# Patient Record
Sex: Female | Born: 1975
Health system: Southern US, Community
[De-identification: ages and names within clinical notes are randomized; demographics above are authoritative.]

## PROBLEM LIST (undated history)

## (undated) DIAGNOSIS — K219 Gastro-esophageal reflux disease without esophagitis: Secondary | ICD-10-CM

## (undated) DIAGNOSIS — M797 Fibromyalgia: Secondary | ICD-10-CM

## (undated) DIAGNOSIS — D472 Monoclonal gammopathy: Secondary | ICD-10-CM

## (undated) DIAGNOSIS — I10 Essential (primary) hypertension: Secondary | ICD-10-CM

## (undated) DIAGNOSIS — J329 Chronic sinusitis, unspecified: Secondary | ICD-10-CM

## (undated) HISTORY — DX: Essential (primary) hypertension: I10

## (undated) HISTORY — PX: KNEE SURGERY: SHX244

## (undated) HISTORY — PX: OTHER SURGICAL HISTORY: SHX169

## (undated) HISTORY — DX: Monoclonal gammopathy: D47.2

## (undated) HISTORY — PX: CHOLECYSTECTOMY: SHX55

---

## 2004-05-30 HISTORY — PX: ESOPHAGOGASTRODUODENOSCOPY: SHX1529

## 2011-01-04 DIAGNOSIS — F32A Depression, unspecified: Secondary | ICD-10-CM | POA: Insufficient documentation

## 2012-09-24 DIAGNOSIS — R079 Chest pain, unspecified: Secondary | ICD-10-CM

## 2012-12-23 DIAGNOSIS — K529 Noninfective gastroenteritis and colitis, unspecified: Secondary | ICD-10-CM | POA: Insufficient documentation

## 2013-03-24 DIAGNOSIS — R1013 Epigastric pain: Secondary | ICD-10-CM | POA: Insufficient documentation

## 2013-08-27 DIAGNOSIS — R5383 Other fatigue: Secondary | ICD-10-CM | POA: Insufficient documentation

## 2013-08-27 DIAGNOSIS — M129 Arthropathy, unspecified: Secondary | ICD-10-CM | POA: Insufficient documentation

## 2013-08-27 DIAGNOSIS — R5381 Other malaise: Secondary | ICD-10-CM | POA: Insufficient documentation

## 2013-08-27 DIAGNOSIS — IMO0001 Reserved for inherently not codable concepts without codable children: Secondary | ICD-10-CM | POA: Insufficient documentation

## 2013-09-04 DIAGNOSIS — J019 Acute sinusitis, unspecified: Secondary | ICD-10-CM | POA: Insufficient documentation

## 2013-11-03 DIAGNOSIS — R42 Dizziness and giddiness: Secondary | ICD-10-CM | POA: Insufficient documentation

## 2013-12-29 DIAGNOSIS — R519 Headache, unspecified: Secondary | ICD-10-CM | POA: Insufficient documentation

## 2014-01-07 DIAGNOSIS — IMO0002 Reserved for concepts with insufficient information to code with codable children: Secondary | ICD-10-CM | POA: Insufficient documentation

## 2015-03-16 ENCOUNTER — Emergency Department (HOSPITAL_COMMUNITY)
Admission: EM | Admit: 2015-03-16 | Discharge: 2015-03-16 | Disposition: A | Discharge status: Home / Self Care | Payer: 59 | Source: Home / Self Care | Attending: Emergency Medicine | Admitting: Emergency Medicine

## 2015-03-16 ENCOUNTER — Encounter (HOSPITAL_COMMUNITY): Payer: Self-pay | Admitting: Emergency Medicine

## 2015-03-16 DIAGNOSIS — R42 Dizziness and giddiness: Secondary | ICD-10-CM | POA: Diagnosis not present

## 2015-03-16 DIAGNOSIS — J324 Chronic pansinusitis: Secondary | ICD-10-CM

## 2015-03-16 HISTORY — DX: Chronic sinusitis, unspecified: J32.9

## 2015-03-16 HISTORY — DX: Fibromyalgia: M79.7

## 2015-03-16 MED ORDER — AZITHROMYCIN 250 MG PO TABS: ORAL_TABLET | ORAL | Status: DC

## 2015-03-16 MED ORDER — ONDANSETRON HCL 4 MG PO TABS: 4.0000 mg | ORAL_TABLET | Freq: Three times a day (TID) | ORAL | Status: DC | PRN

## 2015-03-16 MED ORDER — PREDNISONE 50 MG PO TABS: ORAL_TABLET | ORAL | Status: DC

## 2015-03-16 NOTE — ED Provider Notes (Signed)
CSN: [id number]     Arrival date & time 03/16/15  1308 History   First MD Initiated Contact with Patient 03/16/15 1405     Chief Complaint  Patient presents with  . Facial Pain  . Emesis   (Consider location/radiation/quality/duration/timing/severity/associated sxs/prior Treatment) HPI  She is a 39 year old woman here for evaluation of sinus issues. She reports a multiyear history of sinus issues. She states her PCP treats her for 3 or 4 sinus infections a year. She states she takes Flonase daily and uses nasal saline spray frequently. Over the last 2-3 weeks she has had worsening sinus pressure and pain. She also reports postnasal drainage associated with a cough. No fever or shortness of breath. She has been using her MediPort without improvement. She has also been taking Sudafed, Mucinex, and other over-the-counter agents without improvement. Today, she is started having episodes where her head feels funny, she gets warm and nauseous, and then vomits. After vomiting her symptoms resolve. This has occurred twice today.  She denies feeling lightheaded or a spinning sensation, but states her head just feels off.  Past Medical History  Diagnosis Date  . Fibromyalgia   . Chronic sinus infection    Past Surgical History  Procedure Laterality Date  . Cholecystectomy    . Knee surgery Left   . Thumb surgery Right    History reviewed. No pertinent family history. Social History  Substance Use Topics  . Smoking status: Never Smoker   . Smokeless tobacco: None  . Alcohol Use: Yes     Comment: occasional   OB History    No data available     Review of Systems As in history of present illness Allergies  Sulfa antibiotics  Home Medications   Prior to Admission medications   Medication Sig Start Date End Date Taking? Authorizing Provider  fluticasone (FLONASE) 50 MCG/ACT nasal spray Place 2 sprays into both nostrils daily.   Yes Historical Provider, MD  omeprazole (PRILOSEC) 20 MG  capsule Take 20 mg by mouth daily.   Yes Historical Provider, MD  Phenylephrine HCl (SINEX REGULAR NA) Place 2 sprays into the nose as needed.   Yes Historical Provider, MD  venlafaxine (EFFEXOR) 75 MG tablet Take 75 mg by mouth 2 (two) times daily with a meal.   Yes Historical Provider, MD  azithromycin (ZITHROMAX Z-PAK) 250 MG tablet Take 2 pills today, then 1 pill daily until gone. 03/16/15   Melony Overly, MD  ondansetron (ZOFRAN) 4 MG tablet Take 1 tablet (4 mg total) by mouth every 8 (eight) hours as needed for nausea or vomiting. 03/16/15   Melony Overly, MD  predniSONE (DELTASONE) 50 MG tablet Take 1 pill daily for 5 days. 03/16/15   Melony Overly, MD   Meds Ordered and Administered this Visit  Medications - No data to display  BP 157/79 mmHg  Pulse 83  Temp(Src) 98.3 F (36.8 C) (Oral)  Resp 18  SpO2 99% No data found.   Physical Exam  Constitutional: She is oriented to person, place, and time. She appears well-developed and well-nourished. No distress.  HENT:  Mouth/Throat: No oropharyngeal exudate.  Mild tenderness of all sinuses. Nasal mucosa is slightly edematous. Postnasal drainage seen.  Neck: Neck supple.  Cardiovascular: Normal rate, regular rhythm and normal heart sounds.   No murmur heard. Pulmonary/Chest: Effort normal and breath sounds normal. No respiratory distress. She has no wheezes. She has no rales.  Lymphadenopathy:    She has no cervical adenopathy.  Neurological: She is alert and oriented to person, place, and time.    ED Course  Procedures (including critical care time)  Labs Review Labs Reviewed - No data to display  Imaging Review No results found.    MDM   1. Chronic pansinusitis   2. Vertigo    She is having a flare of her chronic sinusitis. Without a fever, I doubt this is bacterial. We'll treat with 5 days of prednisone.  Zofran given for nausea. If she is not improving by Saturday, she will fill the prescription for azithromycin. She  will follow-up with Dr. Janace Hoard as scheduled next week.    Melony Overly, MD 03/16/15 [phone]

## 2015-03-16 NOTE — ED Notes (Signed)
Pt has chronic sinus infections.  She has been suffering from sinus pain and pressure for 2-3 weeks and today started with dizziness, N&V.  Pt has an appointment with an ENT for next Wednesday, but cannot handle the sinus pain any longer.  She has been using the [employer], sinus pain and congestion OTC medications, Sudafed, and nasal spray.

## 2015-03-16 NOTE — Discharge Instructions (Signed)
You have a lot of inflammation in your sinuses. This is likely affecting your inner ear, causing the dizziness and nausea. Take prednisone daily for the next 5 days. If you are not improving by Saturday, fill the prescription for azithromycin. Continue your Flonase, nasal saline spray, and Sudafed. An over-the-counter allergy medicine such as Claritin or Allegra may be beneficial as well. Follow-up with Dr. Janace Hoard as scheduled next week.

## 2015-04-11 ENCOUNTER — Other Ambulatory Visit (HOSPITAL_COMMUNITY): Payer: Self-pay | Admitting: Otolaryngology

## 2015-04-11 DIAGNOSIS — J0191 Acute recurrent sinusitis, unspecified: Secondary | ICD-10-CM

## 2015-04-14 ENCOUNTER — Ambulatory Visit (HOSPITAL_COMMUNITY)
Admission: RE | Admit: 2015-04-14 | Discharge: 2015-04-14 | Disposition: A | Discharge status: Home / Self Care | Payer: 59 | Source: Ambulatory Visit | Attending: Otolaryngology | Admitting: Otolaryngology

## 2015-04-14 DIAGNOSIS — J0191 Acute recurrent sinusitis, unspecified: Secondary | ICD-10-CM | POA: Diagnosis not present

## 2015-04-18 DIAGNOSIS — M76899 Other specified enthesopathies of unspecified lower limb, excluding foot: Secondary | ICD-10-CM | POA: Insufficient documentation

## 2015-04-18 DIAGNOSIS — M5380 Other specified dorsopathies, site unspecified: Secondary | ICD-10-CM | POA: Insufficient documentation

## 2015-05-04 ENCOUNTER — Emergency Department (HOSPITAL_COMMUNITY): Payer: 59

## 2015-05-04 ENCOUNTER — Emergency Department (HOSPITAL_COMMUNITY)
Admission: EM | Admit: 2015-05-04 | Discharge: 2015-05-04 | Disposition: A | Discharge status: Nursing Home (Includes ICF) | Payer: 59 | Source: Home / Self Care | Attending: Family Medicine | Admitting: Family Medicine

## 2015-05-04 ENCOUNTER — Encounter (HOSPITAL_COMMUNITY): Payer: Self-pay | Admitting: Emergency Medicine

## 2015-05-04 ENCOUNTER — Emergency Department (HOSPITAL_COMMUNITY)
Admission: EM | Admit: 2015-05-04 | Discharge: 2015-05-04 | Disposition: A | Discharge status: Home / Self Care | Payer: 59 | Attending: Emergency Medicine | Admitting: Emergency Medicine

## 2015-05-04 DIAGNOSIS — M797 Fibromyalgia: Secondary | ICD-10-CM | POA: Insufficient documentation

## 2015-05-04 DIAGNOSIS — I10 Essential (primary) hypertension: Secondary | ICD-10-CM

## 2015-05-04 DIAGNOSIS — Z79899 Other long term (current) drug therapy: Secondary | ICD-10-CM | POA: Insufficient documentation

## 2015-05-04 DIAGNOSIS — Z791 Long term (current) use of non-steroidal anti-inflammatories (NSAID): Secondary | ICD-10-CM | POA: Insufficient documentation

## 2015-05-04 DIAGNOSIS — R51 Headache: Secondary | ICD-10-CM | POA: Diagnosis not present

## 2015-05-04 DIAGNOSIS — Z8709 Personal history of other diseases of the respiratory system: Secondary | ICD-10-CM | POA: Insufficient documentation

## 2015-05-04 DIAGNOSIS — R42 Dizziness and giddiness: Secondary | ICD-10-CM | POA: Diagnosis present

## 2015-05-04 LAB — COMPREHENSIVE METABOLIC PANEL
ALBUMIN: 4.1 g/dL (ref 3.5–5.0)
ALT: 19 U/L (ref 14–54)
AST: 20 U/L (ref 15–41)
Alkaline Phosphatase: 74 U/L (ref 38–126)
Anion gap: 7 (ref 5–15)
BILIRUBIN TOTAL: 0.7 mg/dL (ref 0.3–1.2)
BUN: 9 mg/dL (ref 6–20)
CO2: 26 mmol/L (ref 22–32)
CREATININE: 0.87 mg/dL (ref 0.44–1.00)
Calcium: 9.3 mg/dL (ref 8.9–10.3)
Chloride: 106 mmol/L (ref 101–111)
GFR calc Af Amer: >60 mL/min (ref >60)
GLUCOSE: 84 mg/dL (ref 65–99)
POTASSIUM: 3.8 mmol/L (ref 3.5–5.1)
Sodium: 139 mmol/L (ref 135–145)
Total Protein: 7.1 g/dL (ref 6.5–8.1)

## 2015-05-04 LAB — CBC WITH DIFFERENTIAL/PLATELET
BASOS ABS: 0 10*3/uL (ref 0.0–0.1)
BASOS PCT: 0 %
Eosinophils Absolute: 0.1 10*3/uL (ref 0.0–0.7)
Eosinophils Relative: 1 %
HEMATOCRIT: 42.5 % (ref 36.0–46.0)
HEMOGLOBIN: 13.9 g/dL (ref 12.0–15.0)
LYMPHS PCT: 23 %
Lymphs Abs: 1.8 10*3/uL (ref 0.7–4.0)
MCH: 28.6 pg (ref 26.0–34.0)
MCHC: 32.7 g/dL (ref 30.0–36.0)
MCV: 87.4 fL (ref 78.0–100.0)
MONO ABS: 0.4 10*3/uL (ref 0.1–1.0)
Monocytes Relative: 5 %
NEUTROS ABS: 5.7 10*3/uL (ref 1.7–7.7)
NEUTROS PCT: 71 %
Platelets: 313 10*3/uL (ref 150–400)
RBC: 4.86 MIL/uL (ref 3.87–5.11)
RDW: 12.5 % (ref 11.5–15.5)
WBC: 8 10*3/uL (ref 4.0–10.5)

## 2015-05-04 NOTE — ED Notes (Signed)
Pt is here today with a BP of 200/104.  She called her doctor and was told to come here.  Pt is dizzy, has blurred vision and a mild headache.  She denies any CP, SOB, palpitations, or numbness or tingling.  Pt being weened off Effexor due to HBP and will be bridged to Cymbalta.  Pt is being seen by Dr. Juventino Slovak.

## 2015-05-04 NOTE — ED Notes (Signed)
The patient said she works for [employer] at [employer].  She said she felt "weird" and the nurse took her blood pressure and initially it was 200/104 so they sent her here.   She first went to [facility] and she was sent here.  She denies any pain, or any other symptoms other than she feels "weird".  Patient is a [employer].

## 2015-05-04 NOTE — ED Notes (Signed)
Pt being transferred to the ED via shuttle for HBP.  Report was called to Cecille Rubin, First RN in the ED.

## 2015-05-04 NOTE — ED Provider Notes (Signed)
CSN: [id number]     Arrival date & time 05/04/15  1514 History   First MD Initiated Contact with Patient 05/04/15 1747     Chief Complaint  Patient presents with  . Hypertension    The patient said she works for [employer] at [employer].  She said she felt "weird" and the nurse took her blood pressure and initially it was 200/104 so they sent her here.      (Consider location/radiation/quality/duration/timing/severity/associated sxs/prior Treatment) Patient is a 39 y.o. female presenting with dizziness. The history is provided by the patient and medical records.  Dizziness Quality:  Unable to specify Severity:  Mild Onset quality:  Gradual Duration:  1 week Timing:  Intermittent Progression:  Waxing and waning Chronicity:  Recurrent Context: not with bowel movement, not with head movement, not with inactivity, not with loss of consciousness, not with medication and not when standing up   Relieved by:  Nothing Worsened by:  Standing up and movement Ineffective treatments:  Lying down Associated symptoms: headaches   Associated symptoms: no chest pain, no shortness of breath and no weakness   Risk factors: no anemia, no heart disease, no multiple medications and no new medications     Past Medical History  Diagnosis Date  . Fibromyalgia   . Chronic sinus infection    Past Surgical History  Procedure Laterality Date  . Cholecystectomy    . Knee surgery Left   . Thumb surgery Right    History reviewed. No pertinent family history. Social History  Substance Use Topics  . Smoking status: Never Smoker   . Smokeless tobacco: None  . Alcohol Use: Yes     Comment: occasional   OB History    No data available     Review of Systems  Constitutional: Negative for fever and fatigue.  HENT: Negative for facial swelling.   Respiratory: Negative for shortness of breath.   Cardiovascular: Negative for chest pain.  Gastrointestinal: Negative for  abdominal pain.  Genitourinary: Negative for dysuria.  Musculoskeletal: Negative for back pain.  Skin: Negative for rash.  Neurological: Positive for dizziness, light-headedness and headaches. Negative for tremors, syncope, speech difficulty and weakness.  Psychiatric/Behavioral: Positive for confusion and decreased concentration.      Allergies  Sulfa antibiotics  Home Medications   Prior to Admission medications   Medication Sig Start Date End Date Taking? Authorizing Provider  cyclobenzaprine (FLEXERIL) 10 MG tablet Take 10 mg by mouth 3 (three) times daily as needed. 04/19/15  Yes Historical Provider, MD  fluticasone (FLONASE) 50 MCG/ACT nasal spray Place 2 sprays into both nostrils daily.   Yes Historical Provider, MD  naproxen (NAPROSYN) 500 MG tablet Take 500 mg by mouth 2 (two) times daily as needed for moderate pain.   Yes Historical Provider, MD  omeprazole (PRILOSEC) 20 MG capsule Take 20 mg by mouth daily.   Yes Historical Provider, MD  venlafaxine (EFFEXOR) 75 MG tablet Take 75 mg by mouth every evening.    Yes Historical Provider, MD  azithromycin (ZITHROMAX Z-PAK) 250 MG tablet Take 2 pills today, then 1 pill daily until gone. 03/16/15   Melony Overly, MD  DULoxetine (CYMBALTA) 30 MG capsule Take 30 mg by mouth daily.    Historical Provider, MD  ondansetron (ZOFRAN) 4 MG tablet Take 1 tablet (4 mg total) by mouth every 8 (eight) hours as needed for nausea or vomiting. 03/16/15   Melony Overly, MD  predniSONE (DELTASONE) 50 MG tablet Take  1 pill daily for 5 days. 03/16/15   Melony Overly, MD   BP 147/96 mmHg  Pulse 77  Temp(Src) 98.1 F (36.7 C) (Oral)  Resp 22  SpO2 100% Physical Exam  Constitutional: She is oriented to person, place, and time. She appears well-developed and well-nourished. No distress.  HENT:  Head: Normocephalic and atraumatic.  Right Ear: External ear normal.  Left Ear: External ear normal.  Nose: Nose normal.  Mouth/Throat: Oropharynx is clear and  moist. No oropharyngeal exudate.  Eyes: Conjunctivae and EOM are normal. Pupils are equal, round, and reactive to light. Right eye exhibits no discharge. Left eye exhibits no discharge. No scleral icterus.  Neck: Normal range of motion. Neck supple. No JVD present. No tracheal deviation present. No thyromegaly present.  Cardiovascular: Normal rate, regular rhythm, normal heart sounds and intact distal pulses.  Exam reveals no gallop and no friction rub.   No murmur heard. Pulmonary/Chest: Effort normal and breath sounds normal. No stridor. No respiratory distress. She has no wheezes. She has no rales. She exhibits no tenderness.  Abdominal: Soft. She exhibits no distension. There is no tenderness.  Musculoskeletal: Normal range of motion. She exhibits no edema or tenderness.  Lymphadenopathy:    She has no cervical adenopathy.  Neurological: She is alert and oriented to person, place, and time. She has normal strength. She displays no atrophy, no tremor and normal reflexes. No cranial nerve deficit or sensory deficit. She exhibits normal muscle tone. She displays no seizure activity. Coordination and gait normal. GCS eye subscore is 4. GCS verbal subscore is 5. GCS motor subscore is 6.  Skin: Skin is warm and dry. No rash noted. She is not diaphoretic. No erythema. No pallor.  Psychiatric: She has a normal mood and affect. Her behavior is normal. Judgment and thought content normal.  Nursing note and vitals reviewed.   ED Course  Procedures (including critical care time) Labs Review Labs Reviewed  CBC WITH DIFFERENTIAL/PLATELET  COMPREHENSIVE METABOLIC PANEL    Imaging Review Ct Head Wo Contrast  05/04/2015  CLINICAL DATA:  Dizzy send headache; hypertension EXAM: CT HEAD WITHOUT CONTRAST TECHNIQUE: Contiguous axial images were obtained from the base of the skull through the vertex without intravenous contrast. COMPARISON:  None. FINDINGS: The ventricles are normal in size and configuration.  The left lateral ventricle is slightly larger than the right lateral ventricle, an anatomic variant. 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 There is no intracranial mass, hemorrhage, extra-axial fluid collection, or midline shift. The gray-white compartments are normal. No acute infarct apparent. The bony calvarium appears intact. The mastoid air cells are clear. 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 Ventricles are normal in size and configuration. No intracranial mass, hemorrhage, or gray -white compartment lesions/acute appearing infarct. Electronically Signed   By: Lowella Grip III M.D.   On: 05/04/2015 19:30   I have personally reviewed and evaluated these images and lab results as part of my medical decision-making.   EKG Interpretation None      MDM   Final diagnoses:  Essential hypertension    HTN today to 200SBP  Pt "feels weird".  Mild HA, dizziness.  Not-reproducible on exam.  No focal findings.  Also currently tapering off of caffeiene to try to improve BP.  Also transitioning off of Effexor for Cymbalta for FM and depression.  Possibly multifactorial.  Head Ct negative  Labs WNL.  No apparent neurologic deficits on exam.  Discussed HA medicines and pt declined IVF, or MG cocktail.  Appreciative of BP recs and will f/u with PCP.  Patient was given return precautions for hypertension and headaches.  Pt advised on use of medications as applicable.  Advised to return for actely worsening symptoms, inability to take medications, or other acute concerns.  Advised to follow up with PCP in 1 week.  Patient was in agreement with and expressed understanding of follow plan, plan of care, and return precautions.  All questions answered prior to discharge.  Patient was discharged in stable condition, ambulating without difficulty.  Patient care was discussed with my attending, Dr. Roxanne Mins.   Hoyle Sauer, MD 49/35/52  1747  Delora Fuel, MD 15/95/39 [id number]

## 2015-05-04 NOTE — ED Provider Notes (Signed)
CSN: [id number]     Arrival date & time 05/04/15  1358 History   First MD Initiated Contact with Patient 05/04/15 1452     Chief Complaint  Patient presents with  . Hypertension  . Dizziness  . Headache   (Consider location/radiation/quality/duration/timing/severity/associated sxs/prior Treatment) Patient is a 39 y.o. female presenting with hypertension, dizziness, and headaches. The history is provided by the patient.  Hypertension This is a chronic (taken off of bp meds by lmd for possible drug-drug interaction and subsequent hypotension.) problem. The current episode started 6 to 12 hours ago (awoke this am not feeling well with dizziness and hbp and sl nausea and headache.). The problem has been gradually worsening. Associated symptoms include headaches. Pertinent negatives include no chest pain and no shortness of breath.  Dizziness Associated symptoms: headaches and nausea   Associated symptoms: no chest pain, no palpitations and no shortness of breath   Headache Associated symptoms: dizziness and nausea     Past Medical History  Diagnosis Date  . Fibromyalgia   . Chronic sinus infection    Past Surgical History  Procedure Laterality Date  . Cholecystectomy    . Knee surgery Left   . Thumb surgery Right    History reviewed. No pertinent family history. Social History  Substance Use Topics  . Smoking status: Never Smoker   . Smokeless tobacco: None  . Alcohol Use: Yes     Comment: occasional   OB History    No data available     Review of Systems  Constitutional: Negative.   Respiratory: Negative for chest tightness, shortness of breath and wheezing.   Cardiovascular: Positive for leg swelling. Negative for chest pain and palpitations.  Gastrointestinal: Positive for nausea.  Neurological: Positive for dizziness and headaches.  All other systems reviewed and are negative.   Allergies  Sulfa antibiotics  Home Medications   Prior to Admission medications    Medication Sig Start Date End Date Taking? Authorizing Provider  fluticasone (FLONASE) 50 MCG/ACT nasal spray Place 2 sprays into both nostrils daily.   Yes Historical Provider, MD  omeprazole (PRILOSEC) 20 MG capsule Take 20 mg by mouth daily.   Yes Historical Provider, MD  venlafaxine (EFFEXOR) 75 MG tablet Take 75 mg by mouth 2 (two) times daily with a meal.   Yes Historical Provider, MD  azithromycin (ZITHROMAX Z-PAK) 250 MG tablet Take 2 pills today, then 1 pill daily until gone. 03/16/15   Melony Overly, MD  ondansetron (ZOFRAN) 4 MG tablet Take 1 tablet (4 mg total) by mouth every 8 (eight) hours as needed for nausea or vomiting. 03/16/15   Melony Overly, MD  Phenylephrine HCl (SINEX REGULAR NA) Place 2 sprays into the nose as needed.    Historical Provider, MD  predniSONE (DELTASONE) 50 MG tablet Take 1 pill daily for 5 days. 03/16/15   Melony Overly, MD   Meds Ordered and Administered this Visit  Medications - No data to display  BP 157/104 mmHg  Pulse 88  Temp(Src) 99.2 F (37.3 C) (Oral)  SpO2 99% No data found.   Physical Exam  Constitutional: She is oriented to person, place, and time. She appears well-developed and well-nourished. No distress.  HENT:  Head: Normocephalic.  Right Ear: External ear normal.  Left Ear: External ear normal.  Mouth/Throat: Oropharynx is clear and moist.  Eyes: Pupils are equal, round, and reactive to light.  Neck: Normal range of motion. Neck supple.  Cardiovascular: Normal rate, regular rhythm, normal  heart sounds and intact distal pulses.   Pulmonary/Chest: Effort normal and breath sounds normal.  Abdominal: Soft. Bowel sounds are normal.  Lymphadenopathy:    She has no cervical adenopathy.  Neurological: She is alert and oriented to person, place, and time. No cranial nerve deficit. Coordination normal.  Skin: Skin is warm and dry.  Nursing note and vitals reviewed.   ED Course  Procedures (including critical care time)  Labs  Review Labs Reviewed - No data to display ED ECG REPORT   Date: 05/04/2015  Rate: 88  Rhythm: normal sinus rhythm  QRS Axis: normal  Intervals: normal  ST/T Wave abnormalities: normal  Conduction Disutrbances:none  Narrative Interpretation:   Old EKG Reviewed: none available  I have personally reviewed the EKG tracing and agree with the computerized printout as noted.  Imaging Review No results found.   Visual Acuity Review  Right Eye Distance:   Left Eye Distance:   Bilateral Distance:    Right Eye Near:   Left Eye Near:    Bilateral Near:         MDM   1. Hypertension, accelerated    Sent for eval of severe hbp with assoc neuro sx since this am.    Billy Fischer, MD 05/04/15 [phone]

## 2015-05-04 NOTE — ED Notes (Signed)
MD at bedside. 

## 2015-07-04 DIAGNOSIS — K210 Gastro-esophageal reflux disease with esophagitis: Secondary | ICD-10-CM | POA: Diagnosis not present

## 2015-07-04 DIAGNOSIS — M797 Fibromyalgia: Secondary | ICD-10-CM | POA: Diagnosis not present

## 2015-07-04 DIAGNOSIS — I10 Essential (primary) hypertension: Secondary | ICD-10-CM | POA: Diagnosis not present

## 2015-07-15 MED FILL — FLUTICASONE PROP 50 MCG SPR: 50 | 30 days supply | Qty: 16 | Fill #2

## 2015-07-15 MED FILL — DULoxetine HCL 60 MG CPEP: 60 | 90 days supply | Qty: 90 | Fill #0

## 2015-07-19 DIAGNOSIS — H5213 Myopia, bilateral: Secondary | ICD-10-CM | POA: Diagnosis not present

## 2015-07-26 DIAGNOSIS — N951 Menopausal and female climacteric states: Secondary | ICD-10-CM | POA: Diagnosis not present

## 2015-07-26 DIAGNOSIS — Z1231 Encounter for screening mammogram for malignant neoplasm of breast: Secondary | ICD-10-CM | POA: Diagnosis not present

## 2015-07-26 DIAGNOSIS — Z1151 Encounter for screening for human papillomavirus (HPV): Secondary | ICD-10-CM | POA: Diagnosis not present

## 2015-07-26 DIAGNOSIS — Z01419 Encounter for gynecological examination (general) (routine) without abnormal findings: Secondary | ICD-10-CM | POA: Diagnosis not present

## 2015-07-26 DIAGNOSIS — R87810 Cervical high risk human papillomavirus (HPV) DNA test positive: Secondary | ICD-10-CM | POA: Diagnosis not present

## 2015-08-29 MED FILL — OMEPRAZOLE DR 20 MG CAPSULE: 20 | 90 days supply | Qty: 90 | Fill #0

## 2015-08-29 MED FILL — FLUTICASONE PROP 50 MCG SPR: 50 | 30 days supply | Qty: 16 | Fill #3

## 2015-10-05 MED FILL — FLUTICASONE PROP 50 MCG SPR: 50 | 30 days supply | Qty: 16 | Fill #0

## 2015-10-05 MED FILL — CYCLOBENZAPRINE 10 MG TAB: 10 | 15 days supply | Qty: 15 | Fill #0

## 2015-10-31 DIAGNOSIS — R5383 Other fatigue: Secondary | ICD-10-CM | POA: Diagnosis not present

## 2015-10-31 DIAGNOSIS — K210 Gastro-esophageal reflux disease with esophagitis: Secondary | ICD-10-CM | POA: Diagnosis not present

## 2015-10-31 DIAGNOSIS — I10 Essential (primary) hypertension: Secondary | ICD-10-CM | POA: Diagnosis not present

## 2015-11-02 DIAGNOSIS — M9903 Segmental and somatic dysfunction of lumbar region: Secondary | ICD-10-CM | POA: Diagnosis not present

## 2015-11-02 DIAGNOSIS — M9904 Segmental and somatic dysfunction of sacral region: Secondary | ICD-10-CM | POA: Diagnosis not present

## 2015-11-02 DIAGNOSIS — M9905 Segmental and somatic dysfunction of pelvic region: Secondary | ICD-10-CM | POA: Diagnosis not present

## 2015-11-02 DIAGNOSIS — M62838 Other muscle spasm: Secondary | ICD-10-CM | POA: Diagnosis not present

## 2015-11-02 DIAGNOSIS — M9901 Segmental and somatic dysfunction of cervical region: Secondary | ICD-10-CM | POA: Diagnosis not present

## 2015-11-02 DIAGNOSIS — M9902 Segmental and somatic dysfunction of thoracic region: Secondary | ICD-10-CM | POA: Diagnosis not present

## 2015-11-03 DIAGNOSIS — M9904 Segmental and somatic dysfunction of sacral region: Secondary | ICD-10-CM | POA: Diagnosis not present

## 2015-11-03 DIAGNOSIS — M9905 Segmental and somatic dysfunction of pelvic region: Secondary | ICD-10-CM | POA: Diagnosis not present

## 2015-11-03 DIAGNOSIS — M62838 Other muscle spasm: Secondary | ICD-10-CM | POA: Diagnosis not present

## 2015-11-03 DIAGNOSIS — M9903 Segmental and somatic dysfunction of lumbar region: Secondary | ICD-10-CM | POA: Diagnosis not present

## 2015-11-03 DIAGNOSIS — M9901 Segmental and somatic dysfunction of cervical region: Secondary | ICD-10-CM | POA: Diagnosis not present

## 2015-11-03 DIAGNOSIS — M9902 Segmental and somatic dysfunction of thoracic region: Secondary | ICD-10-CM | POA: Diagnosis not present

## 2015-11-04 DIAGNOSIS — M797 Fibromyalgia: Secondary | ICD-10-CM | POA: Diagnosis not present

## 2015-11-04 DIAGNOSIS — K210 Gastro-esophageal reflux disease with esophagitis: Secondary | ICD-10-CM | POA: Diagnosis not present

## 2015-11-04 DIAGNOSIS — M545 Low back pain: Secondary | ICD-10-CM | POA: Diagnosis not present

## 2015-11-04 DIAGNOSIS — I10 Essential (primary) hypertension: Secondary | ICD-10-CM | POA: Diagnosis not present

## 2015-11-07 DIAGNOSIS — M62838 Other muscle spasm: Secondary | ICD-10-CM | POA: Diagnosis not present

## 2015-11-07 DIAGNOSIS — M9903 Segmental and somatic dysfunction of lumbar region: Secondary | ICD-10-CM | POA: Diagnosis not present

## 2015-11-07 DIAGNOSIS — M9901 Segmental and somatic dysfunction of cervical region: Secondary | ICD-10-CM | POA: Diagnosis not present

## 2015-11-07 DIAGNOSIS — M9905 Segmental and somatic dysfunction of pelvic region: Secondary | ICD-10-CM | POA: Diagnosis not present

## 2015-11-07 DIAGNOSIS — M9902 Segmental and somatic dysfunction of thoracic region: Secondary | ICD-10-CM | POA: Diagnosis not present

## 2015-11-07 DIAGNOSIS — M9904 Segmental and somatic dysfunction of sacral region: Secondary | ICD-10-CM | POA: Diagnosis not present

## 2015-11-08 MED FILL — DULoxetine HCL 60 MG CPEP: 60 | 90 days supply | Qty: 90 | Fill #1

## 2015-11-09 DIAGNOSIS — M9905 Segmental and somatic dysfunction of pelvic region: Secondary | ICD-10-CM | POA: Diagnosis not present

## 2015-11-09 DIAGNOSIS — M9904 Segmental and somatic dysfunction of sacral region: Secondary | ICD-10-CM | POA: Diagnosis not present

## 2015-11-09 DIAGNOSIS — M9901 Segmental and somatic dysfunction of cervical region: Secondary | ICD-10-CM | POA: Diagnosis not present

## 2015-11-09 DIAGNOSIS — M62838 Other muscle spasm: Secondary | ICD-10-CM | POA: Diagnosis not present

## 2015-11-09 DIAGNOSIS — M9903 Segmental and somatic dysfunction of lumbar region: Secondary | ICD-10-CM | POA: Diagnosis not present

## 2015-11-09 DIAGNOSIS — M9902 Segmental and somatic dysfunction of thoracic region: Secondary | ICD-10-CM | POA: Diagnosis not present

## 2015-11-23 DIAGNOSIS — D2271 Melanocytic nevi of right lower limb, including hip: Secondary | ICD-10-CM | POA: Diagnosis not present

## 2015-11-23 DIAGNOSIS — L814 Other melanin hyperpigmentation: Secondary | ICD-10-CM | POA: Diagnosis not present

## 2015-11-23 DIAGNOSIS — L738 Other specified follicular disorders: Secondary | ICD-10-CM | POA: Diagnosis not present

## 2015-11-23 DIAGNOSIS — L812 Freckles: Secondary | ICD-10-CM | POA: Diagnosis not present

## 2015-12-01 MED FILL — OMEPRAZOLE DR 20 MG CAPSULE: 20 | 90 days supply | Qty: 90 | Fill #1

## 2015-12-01 MED FILL — FLUTICASONE PROP 50 MCG SPR: 50 | 30 days supply | Qty: 16 | Fill #4

## 2015-12-13 DIAGNOSIS — M797 Fibromyalgia: Secondary | ICD-10-CM | POA: Diagnosis not present

## 2015-12-13 DIAGNOSIS — I10 Essential (primary) hypertension: Secondary | ICD-10-CM | POA: Diagnosis not present

## 2015-12-13 DIAGNOSIS — G43909 Migraine, unspecified, not intractable, without status migrainosus: Secondary | ICD-10-CM | POA: Diagnosis not present

## 2015-12-13 MED FILL — PROPRANOLOL 20 MG TABLET: 20 | 30 days supply | Qty: 30 | Fill #0

## 2015-12-15 ENCOUNTER — Other Ambulatory Visit (HOSPITAL_COMMUNITY): Payer: Self-pay | Admitting: Family Medicine

## 2015-12-15 DIAGNOSIS — R519 Headache, unspecified: Secondary | ICD-10-CM

## 2015-12-15 DIAGNOSIS — R51 Headache: Principal | ICD-10-CM

## 2015-12-22 ENCOUNTER — Ambulatory Visit (HOSPITAL_COMMUNITY)
Admission: RE | Admit: 2015-12-22 | Discharge: 2015-12-22 | Disposition: A | Discharge status: Home / Self Care | Payer: 59 | Source: Ambulatory Visit | Attending: Family Medicine | Admitting: Family Medicine

## 2015-12-22 DIAGNOSIS — R930 Abnormal findings on diagnostic imaging of skull and head, not elsewhere classified: Secondary | ICD-10-CM | POA: Insufficient documentation

## 2015-12-22 DIAGNOSIS — R51 Headache: Secondary | ICD-10-CM | POA: Diagnosis not present

## 2015-12-22 DIAGNOSIS — R519 Headache, unspecified: Secondary | ICD-10-CM

## 2016-01-09 DIAGNOSIS — E669 Obesity, unspecified: Secondary | ICD-10-CM | POA: Insufficient documentation

## 2016-01-10 DIAGNOSIS — Z6833 Body mass index (BMI) 33.0-33.9, adult: Secondary | ICD-10-CM | POA: Diagnosis not present

## 2016-01-10 DIAGNOSIS — G43909 Migraine, unspecified, not intractable, without status migrainosus: Secondary | ICD-10-CM | POA: Diagnosis not present

## 2016-01-25 MED FILL — FLUTICASONE PROP 50 MCG SPR: 50 | 30 days supply | Qty: 16 | Fill #5

## 2016-01-25 MED FILL — PROPRANOLOL ER 60 MG CAPSUL: 60 | 30 days supply | Qty: 30 | Fill #0

## 2016-02-28 MED FILL — OMEPRAZOLE DR 20 MG CAPSULE: 20 | 90 days supply | Qty: 90 | Fill #2

## 2016-02-28 MED FILL — FLUTICASONE PROP 50 MCG SPR: 50 | 30 days supply | Qty: 16 | Fill #6

## 2016-02-28 MED FILL — PROPRANOLOL ER 60 MG CAPSUL: 60 | 30 days supply | Qty: 30 | Fill #1

## 2016-02-28 MED FILL — DULoxetine HCL 60 MG CPEP: 60 | 90 days supply | Qty: 90 | Fill #2

## 2016-03-26 DIAGNOSIS — G43909 Migraine, unspecified, not intractable, without status migrainosus: Secondary | ICD-10-CM | POA: Insufficient documentation

## 2016-03-27 DIAGNOSIS — I10 Essential (primary) hypertension: Secondary | ICD-10-CM | POA: Diagnosis not present

## 2016-03-27 DIAGNOSIS — R51 Headache: Secondary | ICD-10-CM | POA: Diagnosis not present

## 2016-03-27 DIAGNOSIS — G43909 Migraine, unspecified, not intractable, without status migrainosus: Secondary | ICD-10-CM | POA: Diagnosis not present

## 2016-03-27 DIAGNOSIS — M797 Fibromyalgia: Secondary | ICD-10-CM | POA: Diagnosis not present

## 2016-03-27 DIAGNOSIS — K210 Gastro-esophageal reflux disease with esophagitis: Secondary | ICD-10-CM | POA: Diagnosis not present

## 2016-03-27 DIAGNOSIS — Z6834 Body mass index (BMI) 34.0-34.9, adult: Secondary | ICD-10-CM | POA: Diagnosis not present

## 2016-05-25 MED FILL — FLUTICASONE PROP 50 MCG SPR: 50 | 30 days supply | Qty: 16 | Fill #0

## 2016-05-25 MED FILL — OMEPRAZOLE 20 MG CAPSULE DR: 20 | 90 days supply | Qty: 90 | Fill #3

## 2016-05-25 MED FILL — DULoxetine HCL 60 MG CPEP: 60 | 90 days supply | Qty: 90 | Fill #3

## 2016-05-25 MED FILL — PROPRANOLOL ER 60 MG CAPSUL: 60 | 30 days supply | Qty: 30 | Fill #2

## 2016-06-26 DIAGNOSIS — Z6834 Body mass index (BMI) 34.0-34.9, adult: Secondary | ICD-10-CM | POA: Diagnosis not present

## 2016-06-26 DIAGNOSIS — J0101 Acute recurrent maxillary sinusitis: Secondary | ICD-10-CM | POA: Diagnosis not present

## 2016-06-27 MED FILL — SCOPOLAMINE 1 MG/3 DAY PATC: 1 | 12 days supply | Qty: 4 | Fill #0

## 2016-06-29 MED FILL — PROPRANOLOL ER 60 MG CAPSUL: 60 | 30 days supply | Qty: 30 | Fill #3

## 2016-07-06 MED FILL — FLUTICASONE PROP 50 MCG SPR: 50 | 30 days supply | Qty: 16 | Fill #0

## 2016-07-31 DIAGNOSIS — H5213 Myopia, bilateral: Secondary | ICD-10-CM | POA: Diagnosis not present

## 2016-08-31 MED FILL — FLUTICASONE PROP 50 MCG SPR: 50 | 30 days supply | Qty: 16 | Fill #0

## 2016-09-21 MED FILL — CYCLOBENZAPRINE 10 MG TAB: 10 | 90 days supply | Qty: 90 | Fill #0

## 2016-09-25 MED FILL — DULoxetine HCL 60 MG CPEP: 60 | 30 days supply | Qty: 30 | Fill #0

## 2016-09-25 MED FILL — OMEPRAZOLE 20 MG CAPSULE DR: 20 | 30 days supply | Qty: 30 | Fill #0

## 2016-09-25 MED FILL — PROPRANOLOL ER 80 MG CAP: 80 | 30 days supply | Qty: 30 | Fill #0

## 2016-10-09 DIAGNOSIS — Z0000 Encounter for general adult medical examination without abnormal findings: Secondary | ICD-10-CM | POA: Insufficient documentation

## 2016-10-15 DIAGNOSIS — I10 Essential (primary) hypertension: Secondary | ICD-10-CM | POA: Diagnosis not present

## 2016-10-15 DIAGNOSIS — K210 Gastro-esophageal reflux disease with esophagitis: Secondary | ICD-10-CM | POA: Diagnosis not present

## 2016-10-15 DIAGNOSIS — G43909 Migraine, unspecified, not intractable, without status migrainosus: Secondary | ICD-10-CM | POA: Diagnosis not present

## 2016-10-15 DIAGNOSIS — M130 Polyarthritis, unspecified: Secondary | ICD-10-CM | POA: Diagnosis not present

## 2016-10-15 DIAGNOSIS — Z1389 Encounter for screening for other disorder: Secondary | ICD-10-CM | POA: Diagnosis not present

## 2016-10-15 DIAGNOSIS — Z0000 Encounter for general adult medical examination without abnormal findings: Secondary | ICD-10-CM | POA: Diagnosis not present

## 2016-10-15 DIAGNOSIS — Z6835 Body mass index (BMI) 35.0-35.9, adult: Secondary | ICD-10-CM | POA: Diagnosis not present

## 2016-10-15 DIAGNOSIS — M545 Low back pain: Secondary | ICD-10-CM | POA: Diagnosis not present

## 2016-10-15 MED FILL — LISINOPRIL-HCTZ 20-25 MG TA: 20-25 | 90 days supply | Qty: 45 | Fill #0

## 2016-10-15 MED FILL — FUROSEMIDE 20 MG TABLET: 20 | 90 days supply | Qty: 90 | Fill #0

## 2016-10-26 MED FILL — OMEPRAZOLE 20 MG CAPSULE DR: 20 | 90 days supply | Qty: 90 | Fill #0

## 2016-11-12 MED FILL — DULoxetine HCL 60 MG CPEP: 60 | 90 days supply | Qty: 90 | Fill #0

## 2016-11-12 MED FILL — PROPRANOLOL ER 80 MG CAP: 80 | 90 days supply | Qty: 90 | Fill #0

## 2017-01-28 MED FILL — OMEPRAZOLE 20 MG CAP: 20 | 90 days supply | Qty: 90 | Fill #0

## 2017-02-12 DIAGNOSIS — Z1231 Encounter for screening mammogram for malignant neoplasm of breast: Secondary | ICD-10-CM | POA: Diagnosis not present

## 2017-02-12 DIAGNOSIS — Z01419 Encounter for gynecological examination (general) (routine) without abnormal findings: Secondary | ICD-10-CM | POA: Diagnosis not present

## 2017-02-12 DIAGNOSIS — Z6836 Body mass index (BMI) 36.0-36.9, adult: Secondary | ICD-10-CM | POA: Diagnosis not present

## 2017-02-12 DIAGNOSIS — Z30433 Encounter for removal and reinsertion of intrauterine contraceptive device: Secondary | ICD-10-CM | POA: Diagnosis not present

## 2017-02-22 MED FILL — PROPRANOLOL ER 80 MG CAP: 80 | 90 days supply | Qty: 90 | Fill #1

## 2017-02-22 MED FILL — DULoxetine HCL 60 MG CPEP: 60 | 90 days supply | Qty: 90 | Fill #1

## 2017-02-22 MED FILL — CYCLOBENZAPRINE 10 MG TABLE: 10 | 90 days supply | Qty: 90 | Fill #1

## 2017-03-27 DIAGNOSIS — N939 Abnormal uterine and vaginal bleeding, unspecified: Secondary | ICD-10-CM | POA: Diagnosis not present

## 2017-04-15 DIAGNOSIS — M797 Fibromyalgia: Secondary | ICD-10-CM | POA: Diagnosis not present

## 2017-04-15 DIAGNOSIS — J0101 Acute recurrent maxillary sinusitis: Secondary | ICD-10-CM | POA: Diagnosis not present

## 2017-04-15 DIAGNOSIS — K210 Gastro-esophageal reflux disease with esophagitis: Secondary | ICD-10-CM | POA: Diagnosis not present

## 2017-04-15 DIAGNOSIS — I10 Essential (primary) hypertension: Secondary | ICD-10-CM | POA: Diagnosis not present

## 2017-04-15 DIAGNOSIS — Z6834 Body mass index (BMI) 34.0-34.9, adult: Secondary | ICD-10-CM | POA: Diagnosis not present

## 2017-04-15 DIAGNOSIS — M130 Polyarthritis, unspecified: Secondary | ICD-10-CM | POA: Diagnosis not present

## 2017-04-15 DIAGNOSIS — J309 Allergic rhinitis, unspecified: Secondary | ICD-10-CM | POA: Diagnosis not present

## 2017-04-15 MED FILL — CEFUROXIME AXETIL 500 MG TA: 500 | 7 days supply | Qty: 14 | Fill #0

## 2017-05-31 MED FILL — DULoxetine HCL 60 MG CPEP: 60 | 90 days supply | Qty: 90 | Fill #2

## 2017-05-31 MED FILL — OMEPRAZOLE 20 MG CAP: 20 | 90 days supply | Qty: 90 | Fill #1

## 2017-07-02 DIAGNOSIS — I10 Essential (primary) hypertension: Secondary | ICD-10-CM | POA: Insufficient documentation

## 2017-07-02 DIAGNOSIS — K2100 Gastro-esophageal reflux disease with esophagitis, without bleeding: Secondary | ICD-10-CM | POA: Insufficient documentation

## 2017-07-02 DIAGNOSIS — J309 Allergic rhinitis, unspecified: Secondary | ICD-10-CM | POA: Insufficient documentation

## 2017-07-02 NOTE — Progress Notes (Signed)
Office Visit Note  Patient: Katie Elliott             Date of Birth: [birth year]           MRN: [id number]             PCP: Rory Percy, MD Referring: Rory Percy, MD Visit Date: 07/03/2017 Occupation: executive assistant for [employer]    Subjective:  Joint pain.   History of Present Illness: Katie Elliott is a 42 y.o. female seen in consultation per request of her PCP. According to patient her symptoms started when she was in her 55s with bilateral hip pain which she describes over the trochanteric area. She states she's also had discomfort in her knees for some time. She had surgery on her left knee in 2008 for left patellar dislocation. She notices crepitus in her bilateral knee joints. She also reports intermittent swelling in her knee joints. She is complains of pain in her neck and lower back for the last 5 years for which she seen her PCP and was told that she has fibromyalgia syndrome. She has not noticed much improvement in her symptoms of fibromyalgia despite taking medications. She's also seen a chiropractor who did x-rays several years ago and told her that she may have discussed disease of her spine. She has discomfort in her bilateral shoulders or so in the left than the right side. She's been also having discomfort in her bilateral hands and her thumbs for the last 2 years. She has noticed decreased grip strength in her right hand.  Activities of Daily Living:  Patient reports morning stiffness for 1-2 hours.   Patient Reports nocturnal pain.  Difficulty dressing/grooming: Denies Difficulty climbing stairs: Reports Difficulty getting out of chair: Reports Difficulty using hands for taps, buttons, cutlery, and/or writing: Reports   Review of Systems  Constitutional: Positive for fatigue. Negative for night sweats, weight gain, weight loss and weakness.  HENT: Positive for mouth dryness. Negative for mouth sores, trouble swallowing, trouble swallowing and nose  dryness.   Eyes: Positive for dryness. Negative for pain, redness and visual disturbance.  Respiratory: Negative for cough, shortness of breath and difficulty breathing.   Cardiovascular: Positive for hypertension. Negative for chest pain, palpitations, irregular heartbeat and swelling in legs/feet.  Gastrointestinal: Positive for constipation. Negative for blood in stool and diarrhea.  Endocrine: Negative for increased urination.  Genitourinary: Negative for vaginal dryness.  Musculoskeletal: Positive for arthralgias, joint pain and morning stiffness. Negative for joint swelling, myalgias, muscle weakness, muscle tenderness and myalgias.  Skin: Positive for sensitivity to sunlight. Negative for color change, rash, hair loss, skin tightness and ulcers.  Allergic/Immunologic: Negative for susceptible to infections.  Neurological: Negative for dizziness, memory loss and night sweats.  Hematological: Negative for swollen glands.  Psychiatric/Behavioral: Positive for depressed mood and sleep disturbance. The patient is not nervous/anxious.     PMFS History:  Patient Active Problem List   Diagnosis Date Noted  . Essential hypertension 07/02/2017  . Gastroesophageal reflux disease with esophagitis 07/02/2017  . Allergic rhinitis 07/02/2017    Past Medical History:  Diagnosis Date  . Chronic sinus infection   . Fibromyalgia     Family History  Problem Relation Age of Onset  . Bipolar disorder Mother   . Hypertension Father   . Gout Father   . Irritable bowel syndrome Father   . Scoliosis Sister   . Bipolar disorder Sister   . Eczema Sister   . Asthma Sister   .  Hearing loss Sister        has hearing aid    Past Surgical History:  Procedure Laterality Date  . CHOLECYSTECTOMY    . KNEE SURGERY Left   . thumb surgery Right    Social History   Social History Narrative  . Not on file     Objective: Vital Signs: BP 130/72 (BP Location: Left Arm, Patient Position: Sitting,  Cuff Size: Normal)   Pulse 84   Resp 16   Ht 5\' 6"  (1.676 m)   Wt 222 lb (100.7 kg)   BMI 35.83 kg/m    Physical Exam  Constitutional: She is oriented to person, place, and time. She appears well-developed and well-nourished.  HENT:  Head: Normocephalic and atraumatic.  Eyes: Conjunctivae and EOM are normal.  Neck: Normal range of motion.  Cardiovascular: Normal rate, regular rhythm, normal heart sounds and intact distal pulses.  Pulmonary/Chest: Effort normal and breath sounds normal.  Abdominal: Soft. Bowel sounds are normal.  Lymphadenopathy:    She has no cervical adenopathy.  Neurological: She is alert and oriented to person, place, and time.  Skin: Skin is warm and dry. Capillary refill takes less than 2 seconds.  Psychiatric: She has a normal mood and affect. Her behavior is normal.  Nursing note and vitals reviewed.    Musculoskeletal Exam: She has pain and discomfort with range of motion of her C-spine and trapezius is spasm. She is painful range of motion of her left shoulder especially over the anterior aspect. Right shoulder joint is full range of motion. Elbow joints wrist joint MCPs PIPs DIPs with good range of motion. She has tenderness over bilateral CMC and bilateral first MCP joint. No synovitis was noted. Hip joints are good range of motion. She is tenderness over bilateral trochanteric bursa consistent with trochanteric bursitis. She is crepitus in her bilateral knee joints and some tenderness over the medial aspect of her knee joints without any warmth swelling or effusion. Ankle joints MTPs PIPs with good range of motion with no tenderness.  CDAI Exam: No CDAI exam completed.    Investigation: No additional findings.   Imaging: Xr Cervical Spine 2 Or 3 Views  Result Date: 07/03/2017 Multilevel spondylosis was noted. Disc space narrowing and anterior spurring was noted between C5-6 and C6-7. Mild facet joint arthropathy was noted. Impression: multi-level  spondylosis of cervical spine.  Xr Hand 2 View Left  Result Date: 07/03/2017 PIP/DIP narrowing was noted. CMC narrowing was noted. No significant MCP joint narrowing or erosive changes were noted. No intercarpal joint space narrowing was noted. 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  Xr Hand 2 View Right  Result Date: 07/03/2017 Mild PIP/DIP joint space narrowing was noted. CMC joint narrowing and spurring was noted. No MCP joint narrowing or erosive changes were noted. No intercarpal joint space narrowing was noted. Impression: These findings were consistent with mild osteoarthritis.  Xr Knee 3 View Left  Result Date: 07/03/2017 Mild to moderate medial compartment narrowing was noted. No chondrocalcinosis was noted. Mild patellofemoral narrowing was noted. 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  Xr Knee 3 View Right  Result Date: 07/03/2017 Mild to moderate medial compartment narrowing was noted. No chondrocalcinosis was noted. Mild patellofemoral narrowing was noted. 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  Xr Lumbar Spine 2-3 Views  Result Date: 07/03/2017 No significant disc space narrowing was noted. Mild facet joint arthropathy  was noted. No syndesmophytes were noted. No SI joint changes were noted. Impression : unremarkable x-ray of the lumbar spine.  Xr Shoulder Left  Result Date: 07/03/2017 No glenohumeral joint space narrowing was noted. No chondrocalcinosis was noted. Type II acromion was noted. Impression: unremarkable x-ray of the shoulder joint.   Speciality Comments: No specialty comments available.    Procedures:  No procedures performed Allergies: Sulfa antibiotics   Assessment / Plan:     Visit Diagnoses: Chronic left shoulder pain - patient complains of chronic pain in her left shoulder and discomfort. She  has difficulty sleeping on the left side. Plan: XR Shoulder Left. The x-ray was unremarkable I believe her symptoms are related to tendinopathy. I will refer her to physical therapy have also given her a handout on neck exercises.  Pain in both hands - she complains of pain and stiffness in bilateral hands and decreased grip strength. She surgery on her right thumb as child due to a fracture. Plan: XR Hand 2 View Right, XR Hand 2 View Left. She does have some osteoarthritic changes involving her PIPs and CMC joints. She gives history of intermittent hand swelling. I will obtain following labs today. I will also schedule ultrasound to rule out synovitis.  Trochanteric bursitis of both hips: She has trochanteric bursitis bilaterally. ITB and exercise were discussed and handout was given. IT band exercises were discussed and handout was given.  Chronic pain of both knees -she gives history of left patellar dislocation in the past. She has crepitus in her bilateral knee joints. No warmth swelling or effusion was noted. Plan: XR KNEE 3 VIEW RIGHT, XR KNEE 3 VIEW LEFT. 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 Weight loss diet and exercise was discussed.  Neck pain -she's chronic neck pain and had seen a chiropractor in the past. Plan: XR Cervical Spine 2 or 3 views. She does have discussed disease of C-spine which causes discomfort. Proper posture was discussed. I will also refer to physical therapy.  Chronic midline low back pain without sciatica -she has limited range of motion of her lumbar spine with no discomfort. Plan: XR Lumbar Spine 2-3 Views. Her lumbar spine x-ray was unremarkable.  Other fatigue: She has chronic fatigue.  Fibromyalgia: She has generalized pain and discomfort and positive tender points. I will refer her to physical therapy.  Essential hypertension: Her blood pressure is mildly elevated.  Gastroesophageal reflux disease with  esophagitis  Allergic rhinitis  Class 1 obesity   History of migraine    Orders: Orders Placed This Encounter  Procedures  . XR Hand 2 View Right  . XR Hand 2 View Left  . XR KNEE 3 VIEW RIGHT  . XR KNEE 3 VIEW LEFT  . XR Shoulder Left  . XR Cervical Spine 2 or 3 views  . XR Lumbar Spine 2-3 Views   No orders of the defined types were placed in this encounter.   Face-to-face time spent with patient was 50 minutes. Greater than 50% of time was spent in counseling and coordination of care.  Follow-Up Instructions: Return for Polyarthralgia.   Bo Merino, MD  Note - This record has been created using Editor, commissioning.  Chart creation errors have been sought, but may not always  have been located. Such creation errors do not reflect on  the standard of medical care.

## 2017-07-03 ENCOUNTER — Ambulatory Visit (INDEPENDENT_AMBULATORY_CARE_PROVIDER_SITE_OTHER): Payer: Self-pay

## 2017-07-03 ENCOUNTER — Ambulatory Visit (INDEPENDENT_AMBULATORY_CARE_PROVIDER_SITE_OTHER): Payer: 59

## 2017-07-03 ENCOUNTER — Encounter: Payer: Self-pay | Admitting: Rheumatology

## 2017-07-03 ENCOUNTER — Ambulatory Visit: Payer: 59 | Admitting: Rheumatology

## 2017-07-03 VITALS — BP 130/72 | HR 84 | Resp 16 | Ht 66.0 in | Wt 222.0 lb

## 2017-07-03 DIAGNOSIS — M25511 Pain in right shoulder: Secondary | ICD-10-CM

## 2017-07-03 DIAGNOSIS — M25562 Pain in left knee: Secondary | ICD-10-CM | POA: Diagnosis not present

## 2017-07-03 DIAGNOSIS — M545 Low back pain: Secondary | ICD-10-CM | POA: Diagnosis not present

## 2017-07-03 DIAGNOSIS — M7061 Trochanteric bursitis, right hip: Secondary | ICD-10-CM

## 2017-07-03 DIAGNOSIS — K2100 Gastro-esophageal reflux disease with esophagitis, without bleeding: Secondary | ICD-10-CM

## 2017-07-03 DIAGNOSIS — M79642 Pain in left hand: Secondary | ICD-10-CM

## 2017-07-03 DIAGNOSIS — I10 Essential (primary) hypertension: Secondary | ICD-10-CM | POA: Diagnosis not present

## 2017-07-03 DIAGNOSIS — M542 Cervicalgia: Secondary | ICD-10-CM

## 2017-07-03 DIAGNOSIS — J309 Allergic rhinitis, unspecified: Secondary | ICD-10-CM | POA: Diagnosis not present

## 2017-07-03 DIAGNOSIS — M797 Fibromyalgia: Secondary | ICD-10-CM

## 2017-07-03 DIAGNOSIS — M25512 Pain in left shoulder: Secondary | ICD-10-CM

## 2017-07-03 DIAGNOSIS — E6609 Other obesity due to excess calories: Secondary | ICD-10-CM

## 2017-07-03 DIAGNOSIS — M7062 Trochanteric bursitis, left hip: Secondary | ICD-10-CM

## 2017-07-03 DIAGNOSIS — R5383 Other fatigue: Secondary | ICD-10-CM

## 2017-07-03 DIAGNOSIS — M79641 Pain in right hand: Secondary | ICD-10-CM | POA: Diagnosis not present

## 2017-07-03 DIAGNOSIS — M25561 Pain in right knee: Secondary | ICD-10-CM | POA: Diagnosis not present

## 2017-07-03 DIAGNOSIS — Z6834 Body mass index (BMI) 34.0-34.9, adult: Secondary | ICD-10-CM

## 2017-07-03 DIAGNOSIS — Z8669 Personal history of other diseases of the nervous system and sense organs: Secondary | ICD-10-CM

## 2017-07-03 DIAGNOSIS — G8929 Other chronic pain: Secondary | ICD-10-CM

## 2017-07-03 NOTE — Patient Instructions (Addendum)
Hand Exercises Hand exercises can be helpful to almost anyone. These exercises can strengthen the hands, improve flexibility and movement, and increase blood flow to the hands. These results can make work and daily tasks easier. Hand exercises can be especially helpful for people who have joint pain from arthritis or have nerve damage from overuse (carpal tunnel syndrome). These exercises can also help people who have injured a hand. Most of these hand exercises are fairly gentle stretching routines. You can do them often throughout the day. Still, it is a good idea to ask your health care provider which exercises would be best for you. Warming your hands before exercise may help to reduce stiffness. You can do this with gentle massage or by placing your hands in warm water for 15 minutes. Also, make sure you pay attention to your level of hand pain as you begin an exercise routine. Exercises Knuckle Bend Repeat this exercise 5-10 times with each hand. 1. Stand or sit with your arm, hand, and all five fingers pointed straight up. Make sure your wrist is straight. 2. Gently and slowly bend your fingers down and inward until the tips of your fingers are touching the tops of your palm. 3. Hold this position for a few seconds. Extend your fingers out to their original position, all pointi Shoulder Exercises Ask your health care provider which exercises are safe for you. Do exercises exactly as told by your health care provider and adjust them as directed. It is normal to feel mild stretching, pulling, tightness, or discomfort as you do these exercises, but you should stop right away if you feel sudden pain or your pain gets worse.Do not begin these exercises until told by your health care provider. RANGE OF MOTION EXERCISES These exercises warm up your muscles and joints and improve the movement and flexibility of your shoulder. These exercises also help to relieve pain, numbness, and tingling. These  exercises involve stretching your injured shoulder directly. Exercise A: Pendulum  1. Stand near a wall or a surface that you can hold onto for balance. 2. Bend at the waist and let your left / right arm hang straight down. Use your other arm to support you. Keep your back straight and do not lock your knees. 3. Relax your left / right arm and shoulder muscles, and move your hips and your trunk so your left / right arm swings freely. Your arm should swing because of the motion of your body, not because you are using your arm or shoulder muscles. 4. Keep moving your body so your arm swings in the following directions, as told by your health care provider: ? Side to side. ? Forward and backward. ? In clockwise and counterclockwise circles. 5. Continue each motion for __________ seconds, or for as long as told by your health care provider. 6. Slowly return to the starting position. Repeat __________ times. Complete this exercise __________ times a day. Exercise B:Flexion, Standing  1. Stand and hold a broomstick, a cane, or a similar object. Place your hands a little more than shoulder-width apart on the object. Your left / right hand should be palm-up, and your other hand should be palm-down. 2. Keep your elbow straight and keep your shoulder muscles relaxed. Push the stick down with your healthy arm to raise your left / right arm in front of your body, and then over your head until you feel a stretch in your shoulder. ? Avoid shrugging your shoulder while you raise your arm.  Keep your shoulder blade tucked down toward the middle of your back. 3. Hold for __________ seconds. 4. Slowly return to the starting position. Repeat __________ times. Complete this exercise __________ times a day. Exercise C: Abduction, Standing 1. Stand and hold a broomstick, a cane, or a similar object. Place your hands a little more than shoulder-width apart on the object. Your left / right hand should be palm-up, and  your other hand should be palm-down. 2. While keeping your elbow straight and your shoulder muscles relaxed, push the stick across your body toward your left / right side. Raise your left / right arm to the side of your body and then over your head until you feel a stretch in your shoulder. ? Do not raise your arm above shoulder height, unless your health care provider tells you to do that. ? Avoid shrugging your shoulder while you raise your arm. Keep your shoulder blade tucked down toward the middle of your back. 3. Hold for __________ seconds. 4. Slowly return to the starting position. Repeat __________ times. Complete this exercise __________ times a day. Exercise D:Internal Rotation  1. Place your left / right hand behind your back, palm-up. 2. Use your other hand to dangle an exercise band, a towel, or a similar object over your shoulder. Grasp the band with your left / right hand so you are holding onto both ends. 3. Gently pull up on the band until you feel a stretch in the front of your left / right shoulder. ? Avoid shrugging your shoulder while you raise your arm. Keep your shoulder blade tucked down toward the middle of your back. 4. Hold for __________ seconds. 5. Release the stretch by letting go of the band and lowering your hands. Repeat __________ times. Complete this exercise __________ times a day. STRETCHING EXERCISES These exercises warm up your muscles and joints and improve the movement and flexibility of your shoulder. These exercises also help to relieve pain, numbness, and tingling. These exercises are done using your healthy shoulder to help stretch the muscles of your injured shoulder. Exercise E: Warehouse manager (External Rotation and Abduction)  1. Stand in a doorway with one of your feet slightly in front of the other. This is called a staggered stance. If you cannot reach your forearms to the door frame, stand facing a corner of a room. 2. Choose one of the  following positions as told by your health care provider: ? Place your hands and forearms on the door frame above your head. ? Place your hands and forearms on the door frame at the height of your head. ? Place your hands on the door frame at the height of your elbows. 3. Slowly move your weight onto your front foot until you feel a stretch across your chest and in the front of your shoulders. Keep your head and chest upright and keep your abdominal muscles tight. 4. Hold for __________ seconds. 5. To release the stretch, shift your weight to your back foot. Repeat __________ times. Complete this stretch __________ times a day. Exercise F:Extension, Standing 1. Stand and hold a broomstick, a cane, or a similar object behind your back. ? Your hands should be a little wider than shoulder-width apart. ? Your palms should face away from your back. 2. Keeping your elbows straight and keeping your shoulder muscles relaxed, move the stick away from your body until you feel a stretch in your shoulder. ? Avoid shrugging your shoulders while you move the stick.  Keep your shoulder blade tucked down toward the middle of your back. 3. Hold for __________ seconds. 4. Slowly return to the starting position. Repeat __________ times. Complete this exercise __________ times a day. STRENGTHENING EXERCISES These exercises build strength and endurance in your shoulder. Endurance is the ability to use your muscles for a long time, even after they get tired. Exercise G:External Rotation  1. Sit in a stable chair without armrests. 2. Secure an exercise band at elbow height on your left / right side. 3. Place a soft object, such as a folded towel or a small pillow, between your left / right upper arm and your body to move your elbow a few inches away (about 10 cm) from your side. 4. Hold the end of the band so it is tight and there is no slack. 5. Keeping your elbow pressed against the soft object, move your left  / right forearm out, away from your abdomen. Keep your body steady so only your forearm moves. 6. Hold for __________ seconds. 7. Slowly return to the starting position. Repeat __________ times. Complete this exercise __________ times a day. Exercise H:Shoulder Abduction  1. Sit in a stable chair without armrests, or stand. 2. Hold a __________ weight in your left / right hand, or hold an exercise band with both hands. 3. Start with your arms straight down and your left / right palm facing in, toward your body. 4. Slowly lift your left / right hand out to your side. Do not lift your hand above shoulder height unless your health care provider tells you that this is safe. ? Keep your arms straight. ? Avoid shrugging your shoulder while you do this movement. Keep your shoulder blade tucked down toward the middle of your back. 5. Hold for __________ seconds. 6. Slowly lower your arm, and return to the starting position. Repeat __________ times. Complete this exercise __________ times a day. Exercise I:Shoulder Extension 1. Sit in a stable chair without armrests, or stand. 2. Secure an exercise band to a stable object in front of you where it is at shoulder height. 3. Hold one end of the exercise band in each hand. Your palms should face each other. 4. Straighten your elbows and lift your hands up to shoulder height. 5. Step back, away from the secured end of the exercise band, until the band is tight and there is no slack. 6. Squeeze your shoulder blades together as you pull your hands down to the sides of your thighs. Stop when your hands are straight down by your sides. Do not let your hands go behind your body. 7. Hold for __________ seconds. 8. Slowly return to the starting position. Repeat __________ times. Complete this exercise __________ times a day. Exercise J:Standing Shoulder Row 1. Sit in a stable chair without armrests, or stand. 2. Secure an exercise band to a stable object in  front of you so it is at waist height. 3. Hold one end of the exercise band in each hand. Your palms should be in a thumbs-up position. 4. Bend each of your elbows to an "L" shape (about 90 degrees) and keep your upper arms at your sides. 5. Step back until the band is tight and there is no slack. 6. Slowly pull your elbows back behind you. 7. Hold for __________ seconds. 8. Slowly return to the starting position. Repeat __________ times. Complete this exercise __________ times a day. Exercise K:Shoulder Press-Ups  1. Sit in a stable chair that has  armrests. Sit upright, with your feet flat on the floor. 2. Put your hands on the armrests so your elbows are bent and your fingers are pointing forward. Your hands should be about even with the sides of your body. 3. Push down on the armrests and use your arms to lift yourself off of the chair. Straighten your elbows and lift yourself up as much as you comfortably can. ? Move your shoulder blades down, and avoid letting your shoulders move up toward your ears. ? Keep your feet on the ground. As you get stronger, your feet should support less of your body weight as you lift yourself up. 4. Hold for __________ seconds. 5. Slowly lower yourself back into the chair. Repeat __________ times. Complete this exercise __________ times a day. Exercise L: Wall Push-Ups  1. Stand so you are facing a stable wall. Your feet should be about one arm-length away from the wall. 2. Lean forward and place your palms on the wall at shoulder height. 3. Keep your feet flat on the floor as you bend your elbows and lean forward toward the wall. 4. Hold for __________ seconds. 5. Straighten your elbows to push yourself back to the starting position. Repeat __________ times. Complete this exercise __________ times a day. This information is not intended to replace advice given to you by your health care provider. Make sure you discuss any questions you have with your  health care provider. Document Released: 04/25/2005 Document Revised: 03/05/2016 Document Reviewed: 02/20/2015 Elsevier Interactive Patient Education  2018 [place]. 4. ng straight up again.  Finger Fan Repeat this exercise 5-10 times with each hand. 1. Hold your arm and hand out in front of you. Keep your wrist straight. 2. Squeeze your hand into a fist. 3. Hold this position for a few seconds. 4. Edison Simon out, or spread apart, your hand and fingers as much as possible, stretching every joint fully.  Tabletop Repeat this exercise 5-10 times with each hand. 1. Stand or sit with your arm, hand, and all five fingers pointed straight up. Make sure your wrist is straight. 2. Gently and slowly bend your fingers at the knuckles where they meet the hand until your hand is making an upside-down L shape. Your fingers should form a tabletop. 3. Hold this position for a few seconds. 4. Extend your fingers out to their original position, all pointing straight up again.  Making Os Repeat this exercise 5-10 times with each hand. 1. Stand or sit with your arm, hand, and all five fingers pointed straight up. Make sure your wrist is straight. 2. Make an O shape by touching your pointer finger to your thumb. Hold for a few seconds. Then open your hand wide. 3. Repeat this motion with each finger on your hand.  Table Spread Repeat this exercise 5-10 times with each hand. 1. Place your hand on a table with your palm facing down. Make sure your wrist is straight. 2. Spread your fingers out as much as possible. Hold this position for a few seconds. 3. Slide your fingers back together again. Hold for a few seconds.  Ball Grip  Repeat this exercise 10-15 times with each hand. 1. Hold a tennis ball or another soft ball in your hand. 2. While slowly increasing pressure, squeeze the ball as hard as possible. 3. Squeeze as hard as you can for 3-5 seconds. 4. Relax and repeat.  Wrist Curls Repeat this  exercise 10-15 times with each hand. 1. Sit in a  chair that has armrests. 2. Hold a light weight in your hand, such as a dumbbell that weighs 1-3 pounds (0.5-1.4 kg). Ask your health care provider what weight would be best for you. 3. Rest your hand just over the end of the chair arm with your palm facing up. 4. Gently pivot your wrist up and down while holding the weight. Do not twist your wrist from side to side.  Contact a health care provider if:  Your hand pain or discomfort gets much worse when you do an exercise.  Your hand pain or discomfort does not improve within 2 hours after you exercise. If you have any of these problems, stop doing these exercises right away. Do not do them again unless your health care provider says that you can. Get help right away if:  You develop sudden, severe hand pain. If this happens, stop doing these exercises right away. Do not do them again unless your health care provider says that you can. This information is not intended to replace advice given to you by your health care provider. Make sure you discuss any questions you have with your health care provider. Document Released: 05/23/2015 Document Revised: 11/17/2015 Document Reviewed: 12/20/2014 Elsevier Interactive Patient Education  2018 [place] Band Syndrome Rehab Ask your health care provider which exercises are safe for you. Do exercises exactly as told by your health care provider and adjust them as directed. It is normal to feel mild stretching, pulling, tightness, or discomfort as you do these exercises, but you should stop right away if you feel sudden pain or your pain gets worse.Do not begin these exercises until told by your health care provider. Stretching and range of motion exercises These exercises warm up your muscles and joints and improve the movement and flexibility of your hip and pelvis. Exercise A: Quadriceps, prone  1. Lie on your abdomen on a firm surface,  such as a bed or padded floor. 2. Bend your left / right knee and hold your ankle. If you cannot reach your ankle or pant leg, loop a belt around your foot and grab the belt instead. 3. Gently pull your heel toward your buttocks. Your knee should not slide out to the side. You should feel a stretch in the front of your thigh and knee. 4. Hold this position for __________ seconds. Repeat __________ times. Complete this stretch __________ times a day. Exercise B: Iliotibial band  1. Lie on your side with your left / right leg in the top position. 2. Bend both of your knees and grab your left / right ankle. Stretch out your bottom arm to help you balance. 3. Slowly bring your top knee back so your thigh goes behind your trunk. 4. Slowly lower your top leg toward the floor until you feel a gentle stretch on the outside of your left / right hip and thigh. If you do not feel a stretch and your knee will not fall farther, place the heel of your other foot on top of your knee and pull your knee down toward the floor with your foot. 5. Hold this position for __________ seconds. Repeat __________ times. Complete this stretch __________ times a day. Strengthening exercises These exercises build strength and endurance in your hip and pelvis. Endurance is the ability to use your muscles for a long time, even after they get tired. Exercise C: Straight leg raises ( hip abductors) 1. Lie on your side with your left / right leg in  the top position. Lie so your head, shoulder, knee, and hip line up. You may bend your bottom knee to help you balance. 2. Roll your hips slightly forward so your hips are stacked directly over each other and your left / right knee is facing forward. 3. Tense the muscles in your outer thigh and lift your top leg 4-6 inches (10-15 cm). 4. Hold this position for __________ seconds. 5. Slowly return to the starting position. Let your muscles relax completely before doing another  repetition. Repeat __________ times. Complete this exercise __________ times a day. Exercise D: Straight leg raises ( hip extensors) 1. Lie on your abdomen on your bed or a firm surface. You can put a pillow under your hips if that is more comfortable. 2. Bend your left / right knee so your foot is straight up in the air. 3. Squeeze your buttock muscles and lift your left / right thigh off the bed. Do not let your back arch. 4. Tense this muscle as hard as you can without increasing any knee pain. 5. Hold this position for __________ seconds. 6. Slowly lower your leg to the starting position and allow it to relax completely. Repeat __________ times. Complete this exercise __________ times a day. Exercise E: Hip hike 1. Stand sideways on a bottom step. Stand on your left / right leg with your other foot unsupported next to the step. You can hold onto the railing or wall if needed for balance. 2. Keep your knees straight and your torso square. Then, lift your left / right hip up toward the ceiling. 3. Slowly let your left / right hip lower toward the floor, past the starting position. Your foot should get closer to the floor. Do not lean or bend your knees. Repeat __________ times. Complete this exercise __________ times a day. This information is not intended to replace advice given to you by your health care provider. Make sure you discuss any questions you have with your health care provider. Document Released: 06/11/2005 Document Revised: 02/14/2016 Document Reviewed: 05/13/2015 Elsevier Interactive Patient Education  2018 [place]. Cervical Strain and Sprain Rehab Ask your health care provider which exercises are safe for you. Do exercises exactly as told by your health care provider and adjust them as directed. It is normal to feel mild stretching, pulling, tightness, or discomfort as you do these exercises, but you should stop right away if you feel sudden pain or your pain gets  worse.Do not begin these exercises until told by your health care provider. Stretching and range of motion exercises These exercises warm up your muscles and joints and improve the movement and flexibility of your neck. These exercises also help to relieve pain, numbness, and tingling. Exercise A: Cervical side bend  1. Using good posture, sit on a stable chair or stand up. 2. Without moving your shoulders, slowly tilt your left / right ear to your shoulder until you feel a stretch in your neck muscles. You should be looking straight ahead. 3. Hold for __________ seconds. 4. Repeat with the other side of your neck. Repeat __________ times. Complete this exercise __________ times a day. Exercise B: Cervical rotation  1. Using good posture, sit on a stable chair or stand up. 2. Slowly turn your head to the side as if you are looking over your left / right shoulder. ? Keep your eyes level with the ground. ? Stop when you feel a stretch along the side and the back of your neck.  3. Hold for __________ seconds. 4. Repeat this by turning to your other side. Repeat __________ times. Complete this exercise __________ times a day. Exercise C: Thoracic extension and pectoral stretch 1. Roll a towel or a small blanket so it is about 4 inches (10 cm) in diameter. 2. Lie down on your back on a firm surface. 3. Put the towel lengthwise, under your spine in the middle of your back. It should not be not under your shoulder blades. The towel should line up with your spine from your middle back to your lower back. 4. Put your hands behind your head and let your elbows fall out to your sides. 5. Hold for __________ seconds. Repeat __________ times. Complete this exercise __________ times a day. Strengthening exercises These exercises build strength and endurance in your neck. Endurance is the ability to use your muscles for a long time, even after your muscles get tired. Exercise D: Upper cervical flexion,  isometric 1. Lie on your back with a thin pillow behind your head and a small rolled-up towel under your neck. 2. Gently tuck your chin toward your chest and nod your head down to look toward your feet. Do not lift your head off the pillow. 3. Hold for __________ seconds. 4. Release the tension slowly. Relax your neck muscles completely before you repeat this exercise. Repeat __________ times. Complete this exercise __________ times a day. Exercise E: Cervical extension, isometric  1. Stand about 6 inches (15 cm) away from a wall, with your back facing the wall. 2. Place a soft object, about 6-8 inches (15-20 cm) in diameter, between the back of your head and the wall. A soft object could be a small pillow, a ball, or a folded towel. 3. Gently tilt your head back and press into the soft object. Keep your jaw and forehead relaxed. 4. Hold for __________ seconds. 5. Release the tension slowly. Relax your neck muscles completely before you repeat this exercise. Repeat __________ times. Complete this exercise __________ times a day. Posture and body mechanics  Body mechanics refers to the movements and positions of your body while you do your daily activities. Posture is part of body mechanics. Good posture and healthy body mechanics can help to relieve stress in your body's tissues and joints. Good posture means that your spine is in its natural S-curve position (your spine is neutral), your shoulders are pulled back slightly, and your head is not tipped forward. The following are general guidelines for applying improved posture and body mechanics to your everyday activities. Standing  When standing, keep your spine neutral and keep your feet about hip-width apart. Keep a slight bend in your knees. Your ears, shoulders, and hips should line up.  When you do a task in which you stand in one place for a long time, place one foot up on a stable object that is 2-4 inches (5-10 cm) high, such as a  footstool. This helps keep your spine neutral. Sitting   When sitting, keep your spine neutral and your keep feet flat on the floor. Use a footrest, if necessary, and keep your thighs parallel to the floor. Avoid rounding your shoulders, and avoid tilting your head forward.  When working at a desk or a computer, keep your desk at a height where your hands are slightly lower than your elbows. Slide your chair under your desk so you are close enough to maintain good posture.  When working at a computer, place your monitor at a  height where you are looking straight ahead and you do not have to tilt your head forward or downward to look at the screen. Resting When lying down and resting, avoid positions that are most painful for you. Try to support your neck in a neutral position. You can use a contour pillow or a small rolled-up towel. Your pillow should support your neck but not push on it. This information is not intended to replace advice given to you by your health care provider. Make sure you discuss any questions you have with your health care provider. Document Released: 06/11/2005 Document Revised: 02/16/2016 Document Reviewed: 05/18/2015 Elsevier Interactive Patient Education  [employer].

## 2017-07-04 NOTE — Progress Notes (Signed)
Will discuss at follow up visit

## 2017-07-09 LAB — COMPLETE METABOLIC PANEL WITH GFR
AG RATIO: 1.6 (calc) (ref 1.0–2.5)
ALKALINE PHOSPHATASE (APISO): 72 U/L (ref 33–115)
ALT: 36 U/L — AB (ref 6–29)
AST: 25 U/L (ref 10–30)
Albumin: 4.2 g/dL (ref 3.6–5.1)
BUN: 9 mg/dL (ref 7–25)
CALCIUM: 9.7 mg/dL (ref 8.6–10.2)
CHLORIDE: 100 mmol/L (ref 98–110)
CO2: 29 mmol/L (ref 20–32)
Creat: 1.07 mg/dL (ref 0.50–1.10)
GFR, EST NON AFRICAN AMERICAN: 64 mL/min/{1.73_m2} (ref >=60)
GFR, Est African American: 74 mL/min/{1.73_m2} (ref >=60)
GLOBULIN: 2.7 g/dL (ref 1.9–3.7)
Glucose, Bld: 88 mg/dL (ref 65–99)
POTASSIUM: 4 mmol/L (ref 3.5–5.3)
Sodium: 139 mmol/L (ref 135–146)
Total Bilirubin: 0.8 mg/dL (ref 0.2–1.2)
Total Protein: 6.9 g/dL (ref 6.1–8.1)

## 2017-07-09 LAB — CBC WITH DIFFERENTIAL/PLATELET
BASOS ABS: 64 {cells}/uL (ref 0–200)
Basophils Relative: 0.7 %
Eosinophils Absolute: 164 cells/uL (ref 15–500)
Eosinophils Relative: 1.8 %
HEMATOCRIT: 42 % (ref 35.0–45.0)
Hemoglobin: 13.8 g/dL (ref 11.7–15.5)
Lymphs Abs: 2685 cells/uL (ref 850–3900)
MCH: 29.1 pg (ref 27.0–33.0)
MCHC: 32.9 g/dL (ref 32.0–36.0)
MCV: 88.6 fL (ref 80.0–100.0)
MPV: 9.7 fL (ref 7.5–12.5)
Monocytes Relative: 6.9 %
NEUTROS PCT: 61.1 %
Neutro Abs: 5560 cells/uL (ref 1500–7800)
PLATELETS: 385 10*3/uL (ref 140–400)
RBC: 4.74 10*6/uL (ref 3.80–5.10)
RDW: 12.8 % (ref 11.0–15.0)
TOTAL LYMPHOCYTE: 29.5 %
WBC mixed population: 628 cells/uL (ref 200–950)
WBC: 9.1 10*3/uL (ref 3.8–10.8)

## 2017-07-09 LAB — RHEUMATOID FACTOR

## 2017-07-09 LAB — IFE INTERPRETATION

## 2017-07-09 LAB — PROTEIN ELECTROPHORESIS, SERUM, WITH REFLEX
ALBUMIN ELP: 3.2 g/dL — AB (ref 3.8–4.8)
ALPHA 1: 0.2 g/dL (ref 0.2–0.3)
Alpha 2: 0.6 g/dL (ref 0.5–0.9)
BETA 2: 0.3 g/dL (ref 0.2–0.5)
BETA GLOBULIN: 0.4 g/dL (ref 0.4–0.6)
Gamma Globulin: 0.8 g/dL (ref 0.8–1.7)
TOTAL PROTEIN: 5.6 g/dL — AB (ref 6.1–8.1)

## 2017-07-09 LAB — CK: CK TOTAL: 46 U/L (ref 29–143)

## 2017-07-09 LAB — ANA: ANA: NEGATIVE

## 2017-07-09 LAB — TSH: TSH: 2.61 m[IU]/L

## 2017-07-09 LAB — CYCLIC CITRUL PEPTIDE ANTIBODY, IGG: Cyclic Citrullin Peptide Ab: <16 UNITS

## 2017-07-09 LAB — URIC ACID: Uric Acid, Serum: 8.2 mg/dL — ABNORMAL HIGH (ref 2.5–7.0)

## 2017-07-09 LAB — SEDIMENTATION RATE: Sed Rate: 14 mm/h (ref 0–20)

## 2017-07-10 DIAGNOSIS — M5030 Other cervical disc degeneration, unspecified cervical region: Secondary | ICD-10-CM | POA: Diagnosis not present

## 2017-07-10 DIAGNOSIS — M7072 Other bursitis of hip, left hip: Secondary | ICD-10-CM | POA: Diagnosis not present

## 2017-07-10 DIAGNOSIS — M797 Fibromyalgia: Secondary | ICD-10-CM | POA: Diagnosis not present

## 2017-07-10 DIAGNOSIS — M7071 Other bursitis of hip, right hip: Secondary | ICD-10-CM | POA: Diagnosis not present

## 2017-07-17 DIAGNOSIS — M7071 Other bursitis of hip, right hip: Secondary | ICD-10-CM | POA: Diagnosis not present

## 2017-07-17 DIAGNOSIS — M797 Fibromyalgia: Secondary | ICD-10-CM | POA: Diagnosis not present

## 2017-07-17 DIAGNOSIS — M5030 Other cervical disc degeneration, unspecified cervical region: Secondary | ICD-10-CM | POA: Diagnosis not present

## 2017-07-17 DIAGNOSIS — M7072 Other bursitis of hip, left hip: Secondary | ICD-10-CM | POA: Diagnosis not present

## 2017-07-19 ENCOUNTER — Telehealth: Payer: Self-pay | Admitting: *Deleted

## 2017-07-19 DIAGNOSIS — R899 Unspecified abnormal finding in specimens from other organs, systems and tissues: Secondary | ICD-10-CM

## 2017-07-19 NOTE — Telephone Encounter (Signed)
Referral placed for hematology. 

## 2017-07-19 NOTE — Progress Notes (Signed)
Ultrasound examination today did not reveal any synovitis. Her uric acid was 8.2. I had detailed discussion with her regarding that. She is a strong family history of gout in her family. She wants to do dietary modifications for now. Bo Merino, MD

## 2017-07-22 ENCOUNTER — Telehealth: Payer: 59 | Admitting: Nurse Practitioner

## 2017-07-22 DIAGNOSIS — J0100 Acute maxillary sinusitis, unspecified: Secondary | ICD-10-CM | POA: Diagnosis not present

## 2017-07-22 MED ORDER — LEVOFLOXACIN 500 MG PO TABS: 500.0000 mg | ORAL_TABLET | Freq: Every day | ORAL | 0 refills | Status: DC

## 2017-07-22 MED FILL — levoFLOXacin 500 MG TABS: 500 | 7 days supply | Qty: 7 | Fill #0

## 2017-07-22 NOTE — Progress Notes (Signed)

## 2017-07-24 ENCOUNTER — Inpatient Hospital Stay (INDEPENDENT_AMBULATORY_CARE_PROVIDER_SITE_OTHER): Payer: Self-pay

## 2017-07-24 ENCOUNTER — Ambulatory Visit (INDEPENDENT_AMBULATORY_CARE_PROVIDER_SITE_OTHER): Payer: 59 | Admitting: Rheumatology

## 2017-07-24 DIAGNOSIS — M79641 Pain in right hand: Secondary | ICD-10-CM

## 2017-07-24 DIAGNOSIS — M5030 Other cervical disc degeneration, unspecified cervical region: Secondary | ICD-10-CM | POA: Diagnosis not present

## 2017-07-24 DIAGNOSIS — M79642 Pain in left hand: Secondary | ICD-10-CM

## 2017-07-24 DIAGNOSIS — M797 Fibromyalgia: Secondary | ICD-10-CM | POA: Diagnosis not present

## 2017-07-24 DIAGNOSIS — M7072 Other bursitis of hip, left hip: Secondary | ICD-10-CM | POA: Diagnosis not present

## 2017-07-24 DIAGNOSIS — M7071 Other bursitis of hip, right hip: Secondary | ICD-10-CM | POA: Diagnosis not present

## 2017-08-07 NOTE — Progress Notes (Deleted)
Office Visit Note  Patient: Katie Elliott             Date of Birth: 01-27-1976           MRN: [id number]             PCP: Rory Percy, MD Referring: Rory Percy, MD Visit Date: 08/15/2017 Occupation: @GUAROCC @    Subjective:  No chief complaint on file.   History of Present Illness: Katie Elliott is a 42 y.o. female ***   Activities of Daily Living:  Patient reports morning stiffness for *** {minute/hour:19697}.   Patient {ACTIONS;DENIES/REPORTS:21021675::"Denies"} nocturnal pain.  Difficulty dressing/grooming: {ACTIONS;DENIES/REPORTS:21021675::"Denies"} Difficulty climbing stairs: {ACTIONS;DENIES/REPORTS:21021675::"Denies"} Difficulty getting out of chair: {ACTIONS;DENIES/REPORTS:21021675::"Denies"} Difficulty using hands for taps, buttons, cutlery, and/or writing: {ACTIONS;DENIES/REPORTS:21021675::"Denies"}   No Rheumatology ROS completed.   PMFS History:  Patient Active Problem List   Diagnosis Date Noted  . Essential hypertension 07/02/2017  . Gastroesophageal reflux disease with esophagitis 07/02/2017  . Allergic rhinitis 07/02/2017    Past Medical History:  Diagnosis Date  . Chronic sinus infection   . Fibromyalgia     Family History  Problem Relation Age of Onset  . Bipolar disorder Mother   . Hypertension Father   . Gout Father   . Irritable bowel syndrome Father   . Scoliosis Sister   . Bipolar disorder Sister   . Eczema Sister   . Asthma Sister   . Hearing loss Sister        has hearing aid    Past Surgical History:  Procedure Laterality Date  . CHOLECYSTECTOMY    . KNEE SURGERY Left   . thumb surgery Right    Social History   Social History Narrative  . Not on file     Objective: Vital Signs: There were no vitals taken for this visit.   Physical Exam   Musculoskeletal Exam: ***  CDAI Exam: No CDAI exam completed.    Investigation: No additional findings. Component     Latest Ref Rng & Units 07/03/2017    Total Protein     6.1 - 8.1 g/dL 5.6 (L)  Albumin ELP     3.8 - 4.8 g/dL 3.2 (L)  Alpha 1     0.2 - 0.3 g/dL 0.2  Alpha 2     0.5 - 0.9 g/dL 0.6  Beta Globulin     0.4 - 0.6 g/dL 0.4  Beta 2     0.2 - 0.5 g/dL 0.3  Gamma Globulin     0.8 - 1.7 g/dL 0.8  Abnormal Protein Band1     NONE DETEC g/dL NOTE  SPE Interp.        CK Total     29 - 143 U/L 46  TSH     mIU/L 2.61  Uric Acid, Serum     2.5 - 7.0 mg/dL 8.2 (H)  Sed Rate     0 - 20 mm/h 14  RA Latex Turbid.     <65 IU/mL <78  Cyclic Citrullin Peptide Ab     UNITS <16  Anit Nuclear Antibody(ANA)     NEGATIVE NEGATIVE  Immunofix Electr Int         CBC Latest Ref Rng & Units 07/03/2017 05/04/2015  WBC 3.8 - 10.8 Thousand/uL 9.1 8.0  Hemoglobin 11.7 - 15.5 g/dL 13.8 13.9  Hematocrit 35.0 - 45.0 % 42.0 42.5  Platelets 140 - 400 Thousand/uL 385 313   CMP Latest Ref Rng & Units 07/03/2017 07/03/2017 05/04/2015  Glucose  65 - 99 mg/dL - 88 84  BUN 7 - 25 mg/dL - 9 9  Creatinine 0.50 - 1.10 mg/dL - 1.07 0.87  Sodium 135 - 146 mmol/L - 139 139  Potassium 3.5 - 5.3 mmol/L - 4.0 3.8  Chloride 98 - 110 mmol/L - 100 106  CO2 20 - 32 mmol/L - 29 26  Calcium 8.6 - 10.2 mg/dL - 9.7 9.3  Total Protein 6.1 - 8.1 g/dL 5.6(L) 6.9 7.1  Total Bilirubin 0.2 - 1.2 mg/dL - 0.8 0.7  Alkaline Phos 38 - 126 U/L - - 74  AST 10 - 30 U/L - 25 20  ALT 6 - 29 U/L - 36(H) 19   Imaging: [place] Extrem Up Bilat Comp  Result Date: 07/24/2017 Ultrasound examination of bilateral hands was performed per EULAR recommendations. Using 12 MHz transducer, grayscale and power Doppler bilateral second, third, and fifth MCP joints and bilateral wrist joints both dorsal and volar aspects were evaluated to look for synovitis or tenosynovitis. The findings were there was no synovitis or tenosynovitis on ultrasound examination. Right median nerve was 0.05 cm squares which was within normal limits and left median nerve was 0.08 cm squares which was within normal limits.  Impression: Ultrasound examination was within normal limits.   Speciality Comments: No specialty comments available.    Procedures:  No procedures performed Allergies: Sulfa antibiotics   Assessment / Plan:     Visit Diagnoses: No diagnosis found.    Orders: No orders of the defined types were placed in this encounter.  No orders of the defined types were placed in this encounter.   Face-to-face time spent with patient was *** minutes. 50% of time was spent in counseling and coordination of care.  Follow-Up Instructions: No Follow-up on file.   Ofilia Neas, PA-C  Note - This record has been created using Dragon software.  Chart creation errors have been sought, but may not always  have been located. Such creation errors do not reflect on  the standard of medical care.

## 2017-08-15 ENCOUNTER — Ambulatory Visit: Payer: 59 | Admitting: Rheumatology

## 2017-08-21 ENCOUNTER — Ambulatory Visit (HOSPITAL_COMMUNITY): Payer: 59 | Admitting: Internal Medicine

## 2017-08-26 ENCOUNTER — Other Ambulatory Visit: Payer: Self-pay

## 2017-08-26 ENCOUNTER — Encounter (HOSPITAL_COMMUNITY): Payer: Self-pay | Admitting: Internal Medicine

## 2017-08-26 ENCOUNTER — Ambulatory Visit (HOSPITAL_COMMUNITY)
Admission: RE | Admit: 2017-08-26 | Discharge: 2017-08-26 | Disposition: A | Discharge status: Home / Self Care | Payer: 59 | Source: Ambulatory Visit | Attending: Internal Medicine | Admitting: Internal Medicine

## 2017-08-26 ENCOUNTER — Inpatient Hospital Stay (HOSPITAL_COMMUNITY): Payer: 59 | Attending: Internal Medicine | Admitting: Internal Medicine

## 2017-08-26 ENCOUNTER — Inpatient Hospital Stay (HOSPITAL_COMMUNITY): Payer: 59

## 2017-08-26 VITALS — BP 146/98 | HR 109 | Temp 98.1°F | Resp 16 | Ht 66.0 in | Wt 223.5 lb

## 2017-08-26 DIAGNOSIS — K210 Gastro-esophageal reflux disease with esophagitis: Secondary | ICD-10-CM | POA: Insufficient documentation

## 2017-08-26 DIAGNOSIS — E876 Hypokalemia: Secondary | ICD-10-CM | POA: Diagnosis not present

## 2017-08-26 DIAGNOSIS — D472 Monoclonal gammopathy: Secondary | ICD-10-CM

## 2017-08-26 DIAGNOSIS — M797 Fibromyalgia: Secondary | ICD-10-CM | POA: Diagnosis not present

## 2017-08-26 DIAGNOSIS — I10 Essential (primary) hypertension: Secondary | ICD-10-CM

## 2017-08-26 DIAGNOSIS — R5383 Other fatigue: Secondary | ICD-10-CM | POA: Insufficient documentation

## 2017-08-26 DIAGNOSIS — Z79899 Other long term (current) drug therapy: Secondary | ICD-10-CM | POA: Diagnosis not present

## 2017-08-26 DIAGNOSIS — R300 Dysuria: Secondary | ICD-10-CM

## 2017-08-26 DIAGNOSIS — M2550 Pain in unspecified joint: Secondary | ICD-10-CM | POA: Diagnosis not present

## 2017-08-26 LAB — URINALYSIS, ROUTINE W REFLEX MICROSCOPIC
Bilirubin Urine: NEGATIVE
Glucose, UA: NEGATIVE mg/dL
Ketones, ur: NEGATIVE mg/dL
Nitrite: NEGATIVE
Protein, ur: NEGATIVE mg/dL
Specific Gravity, Urine: 1.009 (ref 1.005–1.030)
pH: 5 (ref 5.0–8.0)

## 2017-08-26 LAB — CBC WITH DIFFERENTIAL/PLATELET
Basophils Absolute: 0.1 10*3/uL (ref 0.0–0.1)
Basophils Relative: 1 %
EOS ABS: 0.2 10*3/uL (ref 0.0–0.7)
EOS PCT: 2 %
HCT: 43.6 % (ref 36.0–46.0)
Hemoglobin: 14.4 g/dL (ref 12.0–15.0)
LYMPHS ABS: 3.3 10*3/uL (ref 0.7–4.0)
Lymphocytes Relative: 34 %
MCH: 29.4 pg (ref 26.0–34.0)
MCHC: 33 g/dL (ref 30.0–36.0)
MCV: 89 fL (ref 78.0–100.0)
MONO ABS: 0.7 10*3/uL (ref 0.1–1.0)
MONOS PCT: 8 %
Neutro Abs: 5.4 10*3/uL (ref 1.7–7.7)
Neutrophils Relative %: 55 %
Platelets: 377 10*3/uL (ref 150–400)
RBC: 4.9 MIL/uL (ref 3.87–5.11)
RDW: 12.8 % (ref 11.5–15.5)
WBC: 9.7 10*3/uL (ref 4.0–10.5)

## 2017-08-26 LAB — COMPREHENSIVE METABOLIC PANEL
ALT: 48 U/L (ref 14–54)
ANION GAP: 11 (ref 5–15)
AST: 34 U/L (ref 15–41)
Albumin: 4.1 g/dL (ref 3.5–5.0)
Alkaline Phosphatase: 74 U/L (ref 38–126)
BUN: 9 mg/dL (ref 6–20)
CALCIUM: 9.3 mg/dL (ref 8.9–10.3)
CHLORIDE: 98 mmol/L — AB (ref 101–111)
CO2: 25 mmol/L (ref 22–32)
Creatinine, Ser: 1 mg/dL (ref 0.44–1.00)
GFR calc non Af Amer: >60 mL/min (ref >60)
Glucose, Bld: 90 mg/dL (ref 65–99)
Potassium: 3.1 mmol/L — ABNORMAL LOW (ref 3.5–5.1)
SODIUM: 134 mmol/L — AB (ref 135–145)
Total Bilirubin: 0.8 mg/dL (ref 0.3–1.2)
Total Protein: 7.4 g/dL (ref 6.5–8.1)

## 2017-08-26 LAB — LACTATE DEHYDROGENASE: LDH: 129 U/L (ref 98–192)

## 2017-08-26 LAB — FERRITIN: FERRITIN: 50 ng/mL (ref 11–307)

## 2017-08-26 NOTE — Patient Instructions (Addendum)
[place] at [facility] Discharge Instructions   You were seen today by Dr. Zoila Shutter The lab work that you had done by Dr. Estanislado Pandy showed some faint or minimal elevation in your labs. We will do a urine sample today just to check your kidneys   We will do some additional lab work today  We set you up for a bone scan as well.   We will have you follow up in a few weeks to review all the results   Thank you for choosing [place] at [facility] to provide your oncology and hematology care.  To afford each patient quality time with our provider, please arrive at least 15 minutes before your scheduled appointment time.    If you have a lab appointment with the [facility] please come in thru the  Main Entrance and check in at the main information desk  You need to re-schedule your appointment should you arrive 10 or more minutes late.  We strive to give you quality time with our providers, and arriving late affects you and other patients whose appointments are after yours.  Also, if you no show three or more times for appointments you may be dismissed from the clinic at the providers discretion.     Again, thank you for choosing [facility].  Our hope is that these requests will decrease the amount of time that you wait before being seen by our physicians.       _____________________________________________________________  Should you have questions after your visit to [facility], please contact our office at [phone] between the hours of 8:30 a.m. and 4:30 p.m.  Voicemails left after 4:30 p.m. will not be returned until the following business day.  For prescription refill requests, have your pharmacy contact our office.       Resources For Cancer Patients and their Caregivers ? American Cancer Society: Can assist with transportation, wigs, general needs, runs Look Good Feel Better.         361-188-7275 ? Cancer Care: Provides financial assistance, online support groups, medication/co-pay assistance.  1-800-813-HOPE 775 310 7540) ? [facility] Assists [place] cancer patients and their families through emotional , educational and financial support.  678-321-9342 ? [place] DSS Where to apply for food stamps, Medicaid and utility assistance. (539)180-1066 ? [facility]: Transportation to medical appointments. 207-325-8835 ? Social Security Administration: May apply for disability if have a Stage IV cancer. 662-426-7794 804 517 2651 ? [employer], Disability and Transit Services: Assists with nutrition, care and transit needs. [facility] Support Programs:   > Cancer Support Group  2nd Tuesday of the month 1pm-2pm, Journey Room   > Creative Journey  3rd Tuesday of the month 1130am-1pm, Journey Room

## 2017-08-27 LAB — PROTEIN ELECTROPHORESIS, SERUM
A/G RATIO SPE: 1.3 (ref 0.7–1.7)
ALBUMIN ELP: 4 g/dL (ref 2.9–4.4)
ALPHA-1-GLOBULIN: 0.2 g/dL (ref 0.0–0.4)
Alpha-2-Globulin: 0.8 g/dL (ref 0.4–1.0)
Beta Globulin: 1.2 g/dL (ref 0.7–1.3)
GAMMA GLOBULIN: 1.1 g/dL (ref 0.4–1.8)
Globulin, Total: 3.2 g/dL (ref 2.2–3.9)
TOTAL PROTEIN ELP: 7.2 g/dL (ref 6.0–8.5)

## 2017-08-27 LAB — URINE CULTURE: Culture: >=100000 — AB

## 2017-08-27 LAB — IGG, IGA, IGM
IGA: 313 mg/dL (ref 87–352)
IGG (IMMUNOGLOBIN G), SERUM: 1100 mg/dL (ref 700–1600)
IgM (Immunoglobulin M), Srm: 104 mg/dL (ref 26–217)

## 2017-08-27 LAB — KAPPA/LAMBDA LIGHT CHAINS
KAPPA FREE LGHT CHN: 24.9 mg/L — AB (ref 3.3–19.4)
KAPPA, LAMDA LIGHT CHAIN RATIO: 1.67 — AB (ref 0.26–1.65)
Lambda free light chains: 14.9 mg/L (ref 5.7–26.3)

## 2017-08-30 ENCOUNTER — Ambulatory Visit (HOSPITAL_COMMUNITY): Payer: 59 | Admitting: Internal Medicine

## 2017-09-04 DIAGNOSIS — F411 Generalized anxiety disorder: Secondary | ICD-10-CM | POA: Insufficient documentation

## 2017-09-04 DIAGNOSIS — Z681 Body mass index (BMI) 19 or less, adult: Secondary | ICD-10-CM | POA: Insufficient documentation

## 2017-09-16 ENCOUNTER — Inpatient Hospital Stay (HOSPITAL_BASED_OUTPATIENT_CLINIC_OR_DEPARTMENT_OTHER): Payer: 59 | Admitting: Internal Medicine

## 2017-09-16 ENCOUNTER — Encounter (HOSPITAL_COMMUNITY): Payer: Self-pay | Admitting: Internal Medicine

## 2017-09-16 ENCOUNTER — Other Ambulatory Visit: Payer: Self-pay

## 2017-09-16 VITALS — BP 143/92 | HR 122 | Temp 98.2°F | Resp 20 | Ht 66.0 in | Wt 211.0 lb

## 2017-09-16 DIAGNOSIS — I10 Essential (primary) hypertension: Secondary | ICD-10-CM

## 2017-09-16 DIAGNOSIS — E876 Hypokalemia: Secondary | ICD-10-CM

## 2017-09-16 DIAGNOSIS — R5383 Other fatigue: Secondary | ICD-10-CM | POA: Diagnosis not present

## 2017-09-16 DIAGNOSIS — D472 Monoclonal gammopathy: Secondary | ICD-10-CM

## 2017-09-16 NOTE — Progress Notes (Signed)
Diagnosis Monoclonal gammopathy - Plan: CBC with Differential/Platelet, Comprehensive metabolic panel, Lactate dehydrogenase, Protein electrophoresis, serum, Ferritin, Kappa/lambda light chains, IgG, IgA, IgM, DG Bone Survey Met  Dysuria - Plan: Urinalysis, Routine w reflex microscopic, Urine culture, Urine culture, Urinalysis, Routine w reflex microscopic  Staging Cancer Staging No matching staging information was found for the patient.  Assessment and Plan:  1.  Monoclonal gammopathy.  SPEP negative.  Cr, Ca, HB normal.  She was set up for skeletal survey.  Pt will RTC to go over results.    2.  Joint pain.  Skeletal survey ordered today.  Pt will follow-up to go over results.    3.   Hypertension.  BP is 146/98.  Continue to follow-up with PCP.    HPI:  42 year old female referred for monoclonal gammopathy.  She reports joint pain and chronic fatigue.  She has undergone mammogram reportedly that was negative.  Labs reviewed from 06/2017 showed Hb13.8, normal Cr, Calcium.    Problem List Patient Active Problem List   Diagnosis Date Noted  . Essential hypertension [I10] 07/02/2017  . Gastroesophageal reflux disease with esophagitis [K21.0] 07/02/2017  . Allergic rhinitis [J30.9] 07/02/2017    Past Medical History Past Medical History:  Diagnosis Date  . Chronic sinus infection   . Fibromyalgia     Past Surgical History Past Surgical History:  Procedure Laterality Date  . CHOLECYSTECTOMY    . KNEE SURGERY Left   . thumb surgery Right     Family History Family History  Problem Relation Age of Onset  . Bipolar disorder Mother   . Hypertension Father   . Gout Father   . Irritable bowel syndrome Father   . Scoliosis Sister   . Bipolar disorder Sister   . Eczema Sister   . Asthma Sister   . Hearing loss Sister        has hearing aid      Social History  reports that she has never smoked. She has never used smokeless tobacco. She reports that she drinks alcohol. She  reports that she does not use drugs.  Medications  Current Outpatient Medications:  .  cetirizine (ZYRTEC) 10 MG tablet, Take 10 mg by mouth daily., Disp: , Rfl:  .  cyclobenzaprine (FLEXERIL) 10 MG tablet, Take 10 mg by mouth 3 (three) times daily as needed., Disp: , Rfl: 0 .  DULoxetine (CYMBALTA) 30 MG capsule, Take 60 mg by mouth daily. , Disp: , Rfl:  .  furosemide (LASIX) 20 MG tablet, Take 20 mg by mouth as needed., Disp: , Rfl:  .  lisinopril-hydrochlorothiazide (PRINZIDE,ZESTORETIC) 20-25 MG tablet, Take 1 tablet by mouth daily., Disp: , Rfl:  .  Melatonin 10 MG TABS, Take by mouth daily., Disp: , Rfl:  .  naproxen (NAPROSYN) 500 MG tablet, Take 500 mg by mouth 2 (two) times daily as needed for moderate pain., Disp: , Rfl:  .  omeprazole (PRILOSEC) 20 MG capsule, Take 20 mg by mouth daily., Disp: , Rfl:  .  busPIRone (BUSPAR) 10 MG tablet, Take 10 mg by mouth as needed (for Anxiety)., Disp: , Rfl:   Allergies Sulfa antibiotics  Review of Systems Review of Systems - Oncology ROS as per HPI otherwise 12 point ROS is negative other than chronic fatigue.     Physical Exam  Vitals Wt Readings from Last 3 Encounters:  09/16/17 211 lb (95.7 kg)  08/26/17 223 lb 8 oz (101.4 kg)  07/03/17 222 lb (100.7 kg)  Temp Readings from Last 3 Encounters:  09/16/17 98.2 F (36.8 C) (Oral)  08/26/17 98.1 F (36.7 C) (Oral)  05/04/15 98.1 F (36.7 C) (Oral)   BP Readings from Last 3 Encounters:  09/16/17 (!) 143/92  08/26/17 (!) 146/98  07/03/17 130/72   Pulse Readings from Last 3 Encounters:  09/16/17 (!) 122  08/26/17 (!) 109  07/03/17 84   Constitutional: Well-developed, well-nourished, and in no distress.   HENT: Head: Normocephalic and atraumatic.  Mouth/Throat: No oropharyngeal exudate. Mucosa moist. Eyes: Pupils are equal, round, and reactive to light. Conjunctivae are normal. No scleral icterus.  Neck: Normal range of motion. Neck supple. No JVD present.   Cardiovascular: Normal rate, regular rhythm and normal heart sounds.  Exam reveals no gallop and no friction rub.   No murmur heard. Pulmonary/Chest: Effort normal and breath sounds normal. No respiratory distress. No wheezes.No rales.  Abdominal: Soft. Bowel sounds are normal. No distension. There is no tenderness. There is no guarding.  Musculoskeletal: No edema or tenderness.  Lymphadenopathy: No cervical, axillary or supraclavicular adenopathy.  Neurological: Alert and oriented to person, place, and time. No cranial nerve deficit.  Skin: Skin is warm and dry. No rash noted. No erythema. No pallor.  Psychiatric: Affect and judgment normal.   Labs Appointment on 08/26/2017  Component Date Value Ref Range Status  . WBC 08/26/2017 9.7  4.0 - 10.5 K/uL Final  . RBC 08/26/2017 4.90  3.87 - 5.11 MIL/uL Final  . Hemoglobin 08/26/2017 14.4  12.0 - 15.0 g/dL Final  . HCT 08/26/2017 43.6  36.0 - 46.0 % Final  . MCV 08/26/2017 89.0  78.0 - 100.0 fL Final  . MCH 08/26/2017 29.4  26.0 - 34.0 pg Final  . MCHC 08/26/2017 33.0  30.0 - 36.0 g/dL Final  . RDW 08/26/2017 12.8  11.5 - 15.5 % Final  . Platelets 08/26/2017 377  150 - 400 K/uL Final  . Neutrophils Relative % 08/26/2017 55  % Final  . Neutro Abs 08/26/2017 5.4  1.7 - 7.7 K/uL Final  . Lymphocytes Relative 08/26/2017 34  % Final  . Lymphs Abs 08/26/2017 3.3  0.7 - 4.0 K/uL Final  . Monocytes Relative 08/26/2017 8  % Final  . Monocytes Absolute 08/26/2017 0.7  0.1 - 1.0 K/uL Final  . Eosinophils Relative 08/26/2017 2  % Final  . Eosinophils Absolute 08/26/2017 0.2  0.0 - 0.7 K/uL Final  . Basophils Relative 08/26/2017 1  % Final  . Basophils Absolute 08/26/2017 0.1  0.0 - 0.1 K/uL Final   Performed at [facility], [address] [place], [place] [address]  . Sodium 08/26/2017 134* 135 - 145 mmol/L Final  . Potassium 08/26/2017 3.1* 3.5 - 5.1 mmol/L Final  . Chloride 08/26/2017 98* 101 - 111 mmol/L Final  . CO2 08/26/2017 25  22 - 32  mmol/L Final  . Glucose, Bld 08/26/2017 90  65 - 99 mg/dL Final  . BUN 08/26/2017 9  6 - 20 mg/dL Final  . Creatinine, Ser 08/26/2017 1.00  0.44 - 1.00 mg/dL Final  . Calcium 08/26/2017 9.3  8.9 - 10.3 mg/dL Final  . Total Protein 08/26/2017 7.4  6.5 - 8.1 g/dL Final  . Albumin 08/26/2017 4.1  3.5 - 5.0 g/dL Final  . AST 08/26/2017 34  15 - 41 U/L Final  . ALT 08/26/2017 48  14 - 54 U/L Final  . Alkaline Phosphatase 08/26/2017 74  38 - 126 U/L Final  . Total Bilirubin 08/26/2017 0.8  0.3 - 1.2 mg/dL Final  . GFR calc non Af Amer 08/26/2017 >60  >60 mL/min Final  . GFR calc Af Amer 08/26/2017 >60  >60 mL/min Final   Comment: (NOTE) The eGFR has been calculated using the CKD EPI equation. This calculation has not been validated in all clinical situations. eGFR's persistently <60 mL/min signify possible Chronic Kidney Disease.   Georgiann Hahn gap 08/26/2017 11  5 - 15 Final   Performed at [facility], [address], [place], [place] [address]  . LDH 08/26/2017 129  98 - 192 U/L Final   Performed at [facility], [address], [place], [place] [address]  . Total Protein ELP 08/26/2017 7.2  6.0 - 8.5 g/dL Final  . Albumin ELP 08/26/2017 4.0  2.9 - 4.4 g/dL Final  . Alpha-1-Globulin 08/26/2017 0.2  0.0 - 0.4 g/dL Final  . Alpha-2-Globulin 08/26/2017 0.8  0.4 - 1.0 g/dL Final  . Beta Globulin 08/26/2017 1.2  0.7 - 1.3 g/dL Final  . Gamma Globulin 08/26/2017 1.1  0.4 - 1.8 g/dL Final  . M-Spike, % 08/26/2017 Not Observed  Not Observed g/dL Final  . SPE Interp. 08/26/2017 Comment   Final   Comment: (NOTE) The SPE pattern appears essentially unremarkable. Evidence of monoclonal protein is not apparent. Performed At: [facility] [place], [place] [id number] Rush Farmer MD [id number]   . Comment 08/26/2017 Comment   Final   Comment: (NOTE) Protein electrophoresis scan will follow via computer, mail, or courier delivery.   Marland Kitchen GLOBULIN, TOTAL 08/26/2017 3.2  2.2 -  3.9 g/dL Corrected  . A/G Ratio 08/26/2017 1.3  0.7 - 1.7 Corrected   Performed at [facility], [address] [place], [place] [address]  . Ferritin 08/26/2017 50  11 - 307 ng/mL Final   Performed at [facility], [place] [address] [place], [place] [address]  . Kappa free light chain 08/26/2017 24.9* 3.3 - 19.4 mg/L Final  . Lamda free light chains 08/26/2017 14.9  5.7 - 26.3 mg/L Final  . Kappa, lamda light chain ratio 08/26/2017 1.67* 0.26 - 1.65 Final   Comment: (NOTE) Performed At: [facility] [place], [place] [id number] Rush Farmer MD [id number] Performed at [facility], [address], [place], [place] [address]   . IgG (Immunoglobin G), Serum 08/26/2017 1,100  700 - 1,600 mg/dL Final  . IgA 08/26/2017 313  87 - 352 mg/dL Final  . IgM (Immunoglobulin M), Srm 08/26/2017 104  26 - 217 mg/dL Final   Comment: (NOTE) Performed At: [facility] [place], [place] [id number] Rush Farmer MD [id number] Performed at [facility], [address], [place], [place] [address]   Office Visit on 08/26/2017  Component Date Value Ref Range Status  . Specimen Description 08/26/2017    Final                   Value:URINE, CLEAN CATCH Performed at [facility], [address], [place], [place] [address]   . Special Requests 08/26/2017    Final                   Value:NONE Performed at [facility], [address] [place], [place] [address]   . Culture 08/26/2017 *  Final                   Value:>=100,000 COLONIES/mL LACTOBACILLUS SPECIES Standardized susceptibility testing for this organism is not available. Performed at [facility], [place] [address], [place], [place] [address]   . Report Status 08/26/2017 08/27/2017 FINAL   Final  . Color, Urine 08/26/2017 YELLOW  YELLOW Final  . APPearance 08/26/2017 HAZY* CLEAR Final  . Specific Gravity, Urine 08/26/2017 1.009  1.005 - 1.030 Final  . pH 08/26/2017 5.0  5.0 - 8.0 Final  .  Glucose, UA 08/26/2017 NEGATIVE  NEGATIVE mg/dL Final  . Hgb urine dipstick 08/26/2017 SMALL* NEGATIVE Final  . Bilirubin Urine 08/26/2017 NEGATIVE  NEGATIVE Final  . Ketones, ur 08/26/2017 NEGATIVE  NEGATIVE mg/dL Final  . Protein, ur 08/26/2017 NEGATIVE  NEGATIVE mg/dL Final  . Nitrite 08/26/2017 NEGATIVE  NEGATIVE Final  . Leukocytes, UA 08/26/2017 TRACE* NEGATIVE Final  . RBC / HPF 08/26/2017 0-5  0 - 5 RBC/hpf Final  . WBC, UA 08/26/2017 0-5  0 - 5 WBC/hpf Final  . Bacteria, UA 08/26/2017 RARE* NONE SEEN Final  . Squamous Epithelial / LPF 08/26/2017 6-30* NONE SEEN Final  . Mucus 08/26/2017 PRESENT   Final  . Hyaline Casts, UA 08/26/2017 PRESENT   Final  . Sperm, UA 08/26/2017 PRESENT   Final   Performed at [facility], [address] [place], [place] [address]     Pathology Orders Placed This Encounter  Procedures  . Urine culture    Standing Status:   Future    Number of Occurrences:   1    Standing Expiration Date:   08/26/2018  . DG Bone Survey Met    Standing Status:   Future    Number of Occurrences:   1    Standing Expiration Date:   10/27/2018    Order Specific Question:   Reason for Exam (SYMPTOM  OR DIAGNOSIS REQUIRED)    Answer:   monoclonal gammopathy    Order Specific Question:   Is patient pregnant?    Answer:   No    Order Specific Question:   Preferred imaging location?    Answer:   [facility]    Order Specific Question:   Radiology Contrast Protocol - do NOT remove file path    Answer:   [email or web address]  . CBC with Differential/Platelet    Standing Status:   Future    Number of Occurrences:   1    Standing Expiration Date:   08/27/2018  . Comprehensive metabolic panel    Standing Status:   Future    Number of Occurrences:   1    Standing Expiration Date:   08/27/2018  . Lactate dehydrogenase    Standing Status:   Future    Number of Occurrences:   1    Standing Expiration Date:   08/27/2018  . Protein  electrophoresis, serum    Standing Status:   Future    Number of Occurrences:   1    Standing Expiration Date:   08/27/2018  . Ferritin    Standing Status:   Future    Number of Occurrences:   1    Standing Expiration Date:   08/27/2018  . Kappa/lambda light chains    Standing Status:   Future    Number of Occurrences:   1    Standing Expiration Date:   08/26/2018  . IgG, IgA, IgM    Standing Status:   Future    Number of Occurrences:   1    Standing Expiration Date:   08/26/2018  . Urinalysis, Routine w reflex microscopic    Standing Status:   Future  Number of Occurrences:   1    Standing Expiration Date:   08/26/2018       Zoila Shutter MD

## 2017-09-16 NOTE — Progress Notes (Signed)
Diagnosis Monoclonal gammopathy - Plan: CBC with Differential/Platelet, Comprehensive metabolic panel, Lactate dehydrogenase, Protein electrophoresis, serum  Staging Cancer Staging No matching staging information was found for the patient.  Assessment and Plan:  1.  Monoclonal gammopathy.  Labs reviewed with the patient.  She has a negative SPEP, hemoglobin is normal at 14, creatinine is 1, calcium is 9.3.  Quantitative immunoglobulins are normal.  Urine protein is negative. Skeletal survey performed showed no lytic lesions.  I have discussed with the patient lab studies and x-rays are normal.  She will return to clinic in 6 months for repeat lab evaluation and likely at that time may be assigned to as needed follow-up.  She is advised to notify the office if she has any problems prior to her next visit.  2.  Joint pain.  Skeletal survey performed 08/26/2017 was negative.  She should continue to follow with her primary care physician if ongoing symptoms.  3.  Fatigue.  Hemoglobin is normal at 14.  She will discuss with her primary care physician if her thyroid levels have been checked.  They may also need to determine if patient may have chronic fatigue syndrome.  4.  Health maintenance.  She reports she has undergone mammogram evaluation in the past.  5.  Hypokalemia.  Potassium was 3.1.  She reports primary care physician has increased her Lasix to daily.  She was previously taking it as needed.  She will see them next week and I have asked for her to discuss with them as she needs to be on potassium supplementation.  6.  Hypertension.  Blood pressure is 143/92.  She should continue to follow with her primary care physician.  Interval History: 42 year old female who is referred for evaluation due to reported monoclonal gammopathy.  She also reports joint pain.  Current Status: Pt seen today for follow-up.  She is here today to go over lab studies.  She continues to complain of chronic  fatigue.  Problem List Patient Active Problem List   Diagnosis Date Noted  . Essential hypertension [I10] 07/02/2017  . Gastroesophageal reflux disease with esophagitis [K21.0] 07/02/2017  . Allergic rhinitis [J30.9] 07/02/2017    Past Medical History Past Medical History:  Diagnosis Date  . Chronic sinus infection   . Fibromyalgia     Past Surgical History Past Surgical History:  Procedure Laterality Date  . CHOLECYSTECTOMY    . KNEE SURGERY Left   . thumb surgery Right     Family History Family History  Problem Relation Age of Onset  . Bipolar disorder Mother   . Hypertension Father   . Gout Father   . Irritable bowel syndrome Father   . Scoliosis Sister   . Bipolar disorder Sister   . Eczema Sister   . Asthma Sister   . Hearing loss Sister        has hearing aid      Social History  reports that she has never smoked. She has never used smokeless tobacco. She reports that she drinks alcohol. She reports that she does not use drugs.  Medications  Current Outpatient Medications:  .  busPIRone (BUSPAR) 10 MG tablet, Take 10 mg by mouth as needed (for Anxiety)., Disp: , Rfl:  .  cetirizine (ZYRTEC) 10 MG tablet, Take 10 mg by mouth daily., Disp: , Rfl:  .  cyclobenzaprine (FLEXERIL) 10 MG tablet, Take 10 mg by mouth 3 (three) times daily as needed., Disp: , Rfl: 0 .  DULoxetine (CYMBALTA) 30 MG  capsule, Take 60 mg by mouth daily. , Disp: , Rfl:  .  furosemide (LASIX) 20 MG tablet, Take 20 mg by mouth as needed., Disp: , Rfl:  .  lisinopril-hydrochlorothiazide (PRINZIDE,ZESTORETIC) 20-25 MG tablet, Take 1 tablet by mouth daily., Disp: , Rfl:  .  Melatonin 10 MG TABS, Take by mouth daily., Disp: , Rfl:  .  naproxen (NAPROSYN) 500 MG tablet, Take 500 mg by mouth 2 (two) times daily as needed for moderate pain., Disp: , Rfl:  .  omeprazole (PRILOSEC) 20 MG capsule, Take 20 mg by mouth daily., Disp: , Rfl:   Allergies Sulfa antibiotics  Review of Systems Review of  Systems - Oncology ROS as per HPI otherwise 12 point ROS is negative.   Physical Exam  Vitals: Temperature 98.2 heart rate 122 respirations 20 blood pressure 143/92.  Pulse ox 97% on room air. Wt Readings from Last 3 Encounters:  08/26/17 223 lb 8 oz (101.4 kg)  07/03/17 222 lb (100.7 kg)   Temp Readings from Last 3 Encounters:  08/26/17 98.1 F (36.7 C) (Oral)  05/04/15 98.1 F (36.7 C) (Oral)  05/04/15 99.2 F (37.3 C) (Oral)   BP Readings from Last 3 Encounters:  08/26/17 (!) 146/98  07/03/17 130/72  05/04/15 143/92   Pulse Readings from Last 3 Encounters:  08/26/17 (!) 109  07/03/17 84  05/04/15 84   Constitutional: Well-developed, well-nourished, and in no distress.   HENT: Head: Normocephalic and atraumatic.  Mouth/Throat: No oropharyngeal exudate. Mucosa moist. Eyes: Pupils are equal, round, and reactive to light. Conjunctivae are normal. No scleral icterus.  Neck: Normal range of motion. Neck supple. No JVD present.  Cardiovascular: Tachycardic, Exam reveals no gallop and no friction rub.  No murmur heard. Pulmonary/Chest: Effort normal and breath sounds normal. No respiratory distress. No wheezes.No rales.  Abdominal: Soft. Bowel sounds are normal. No distension. There is no tenderness. There is no guarding.  Musculoskeletal: No edema or tenderness.  Lymphadenopathy: No cervical, axillary or supraclavicular adenopathy.  Neurological: Alert and oriented to person, place, and time. No cranial nerve deficit.  Skin: Skin is warm and dry. No rash noted. No erythema. No pallor.  Psychiatric: Affect and judgment normal.   Labs No visits with results within 3 Day(s) from this visit.  Latest known visit with results is:  Appointment on 08/26/2017  Component Date Value Ref Range Status  . WBC 08/26/2017 9.7  4.0 - 10.5 K/uL Final  . RBC 08/26/2017 4.90  3.87 - 5.11 MIL/uL Final  . Hemoglobin 08/26/2017 14.4  12.0 - 15.0 g/dL Final  . HCT 08/26/2017 43.6  36.0 - 46.0  % Final  . MCV 08/26/2017 89.0  78.0 - 100.0 fL Final  . MCH 08/26/2017 29.4  26.0 - 34.0 pg Final  . MCHC 08/26/2017 33.0  30.0 - 36.0 g/dL Final  . RDW 08/26/2017 12.8  11.5 - 15.5 % Final  . Platelets 08/26/2017 377  150 - 400 K/uL Final  . Neutrophils Relative % 08/26/2017 55  % Final  . Neutro Abs 08/26/2017 5.4  1.7 - 7.7 K/uL Final  . Lymphocytes Relative 08/26/2017 34  % Final  . Lymphs Abs 08/26/2017 3.3  0.7 - 4.0 K/uL Final  . Monocytes Relative 08/26/2017 8  % Final  . Monocytes Absolute 08/26/2017 0.7  0.1 - 1.0 K/uL Final  . Eosinophils Relative 08/26/2017 2  % Final  . Eosinophils Absolute 08/26/2017 0.2  0.0 - 0.7 K/uL Final  . Basophils Relative 08/26/2017 1  %  Final  . Basophils Absolute 08/26/2017 0.1  0.0 - 0.1 K/uL Final   Performed at [facility], [address] [place], [place] [address]  . Sodium 08/26/2017 134* 135 - 145 mmol/L Final  . Potassium 08/26/2017 3.1* 3.5 - 5.1 mmol/L Final  . Chloride 08/26/2017 98* 101 - 111 mmol/L Final  . CO2 08/26/2017 25  22 - 32 mmol/L Final  . Glucose, Bld 08/26/2017 90  65 - 99 mg/dL Final  . BUN 08/26/2017 9  6 - 20 mg/dL Final  . Creatinine, Ser 08/26/2017 1.00  0.44 - 1.00 mg/dL Final  . Calcium 08/26/2017 9.3  8.9 - 10.3 mg/dL Final  . Total Protein 08/26/2017 7.4  6.5 - 8.1 g/dL Final  . Albumin 08/26/2017 4.1  3.5 - 5.0 g/dL Final  . AST 08/26/2017 34  15 - 41 U/L Final  . ALT 08/26/2017 48  14 - 54 U/L Final  . Alkaline Phosphatase 08/26/2017 74  38 - 126 U/L Final  . Total Bilirubin 08/26/2017 0.8  0.3 - 1.2 mg/dL Final  . GFR calc non Af Amer 08/26/2017 >60  >60 mL/min Final  . GFR calc Af Amer 08/26/2017 >60  >60 mL/min Final   Comment: (NOTE) The eGFR has been calculated using the CKD EPI equation. This calculation has not been validated in all clinical situations. eGFR's persistently <60 mL/min signify possible Chronic Kidney Disease.   Georgiann Hahn gap 08/26/2017 11  5 - 15 Final   Performed at [facility], [address] [place], [place] [address]  . LDH 08/26/2017 129  98 - 192 U/L Final   Performed at [facility], [address] [place], [place] [address]  . Total Protein ELP 08/26/2017 7.2  6.0 - 8.5 g/dL Final  . Albumin ELP 08/26/2017 4.0  2.9 - 4.4 g/dL Final  . Alpha-1-Globulin 08/26/2017 0.2  0.0 - 0.4 g/dL Final  . Alpha-2-Globulin 08/26/2017 0.8  0.4 - 1.0 g/dL Final  . Beta Globulin 08/26/2017 1.2  0.7 - 1.3 g/dL Final  . Gamma Globulin 08/26/2017 1.1  0.4 - 1.8 g/dL Final  . M-Spike, % 08/26/2017 Not Observed  Not Observed g/dL Final  . SPE Interp. 08/26/2017 Comment   Final   Comment: (NOTE) The SPE pattern appears essentially unremarkable. Evidence of monoclonal protein is not apparent. Performed At: [facility] [place], [place] [id number] Rush Farmer MD OE:[id number]   . Comment 08/26/2017 Comment   Final   Comment: (NOTE) Protein electrophoresis scan will follow via computer, mail, or courier delivery.   Marland Kitchen GLOBULIN, TOTAL 08/26/2017 3.2  2.2 - 3.9 g/dL Corrected  . A/G Ratio 08/26/2017 1.3  0.7 - 1.7 Corrected   Performed at [facility], [address] [place], [place] [address]  . Ferritin 08/26/2017 50  11 - 307 ng/mL Final   Performed at [facility], [place] [address], [place], [place] [address]  . Kappa free light chain 08/26/2017 24.9* 3.3 - 19.4 mg/L Final  . Lamda free light chains 08/26/2017 14.9  5.7 - 26.3 mg/L Final  . Kappa, lamda light chain ratio 08/26/2017 1.67* 0.26 - 1.65 Final   Comment: (NOTE) Performed At: [facility] [place], [place] [id number] Rush Farmer MD IW:[id number] Performed at [facility], [address], [place], [place] [address]   . IgG (Immunoglobin G), Serum 08/26/2017 1,100  700 - 1,600 mg/dL Final  . IgA 08/26/2017 313  87 - 352 mg/dL Final  . IgM (  Immunoglobulin M), Srm 08/26/2017 104  26 - 217 mg/dL Final   Comment: (NOTE) Performed At: [facility] [place], [place] [id number] Rush Farmer MD SW:[id number] Performed at [facility], [address], [place], [place] [address]      Pathology Orders Placed This Encounter  Procedures  . CBC with Differential/Platelet    Standing Status:   Future    Standing Expiration Date:   09/17/2018  . Comprehensive metabolic panel    Standing Status:   Future    Standing Expiration Date:   09/17/2018  . Lactate dehydrogenase    Standing Status:   Future    Standing Expiration Date:   09/17/2018  . Protein electrophoresis, serum    Standing Status:   Future    Standing Expiration Date:   09/17/2018       Zoila Shutter MD

## 2017-09-16 NOTE — Patient Instructions (Signed)
[place] at [facility] Discharge Instructions   You were seen today by Dr. Zoila Shutter. She went over your recent lab work and everything looked good except your potassium was a little low. Try eating potassium rich foods to help with that. She also went over your recent xray and it was negative. We will see you back in 6 months for labs and follow up.    Thank you for choosing [place] at [facility] to provide your oncology and hematology care.  To afford each patient quality time with our provider, please arrive at least 15 minutes before your scheduled appointment time.    If you have a lab appointment with the [facility] please come in thru the  Main Entrance and check in at the main information desk  You need to re-schedule your appointment should you arrive 10 or more minutes late.  We strive to give you quality time with our providers, and arriving late affects you and other patients whose appointments are after yours.  Also, if you no show three or more times for appointments you may be dismissed from the clinic at the providers discretion.     Again, thank you for choosing [facility].  Our hope is that these requests will decrease the amount of time that you wait before being seen by our physicians.       _____________________________________________________________  Should you have questions after your visit to [facility] please contact our office at [phone] between the hours of 8:30 a.m. and 4:30 p.m.  Voicemails left after 4:30 p.m. will not be returned until the following business day.  For prescription refill requests, have your pharmacy contact our office.       Resources For Cancer Patients and their Caregivers ? American Cancer Society: Can assist with transportation, wigs, general needs, runs Look Good Feel Better.        432-054-1503 ? Cancer Care: Provides financial  assistance, online support groups, medication/co-pay assistance.  1-800-813-HOPE 989-316-7624) ? [place] Assists [place] cancer patients and their families through emotional , educational and financial support.  (276)862-0756 ? [place] DSS Where to apply for food stamps, Medicaid and utility assistance. 904-340-7555 ? [facility]: Transportation to medical appointments. (310)699-0667 ? Social Security Administration: May apply for disability if have a Stage IV cancer. 707-725-2240 (630)312-0082 ? [employer], Disability and Transit Services: Assists with nutrition, care and transit needs. [facility] Support Programs:   > Cancer Support Group  2nd Tuesday of the month 1pm-2pm, Journey Room   > Creative Journey  3rd Tuesday of the month 1130am-1pm, Journey Room

## 2018-03-14 ENCOUNTER — Inpatient Hospital Stay (HOSPITAL_COMMUNITY): Payer: 59 | Attending: Internal Medicine

## 2018-03-21 ENCOUNTER — Ambulatory Visit (HOSPITAL_COMMUNITY): Payer: 59 | Admitting: Hematology

## 2018-03-21 NOTE — Progress Notes (Deleted)
[place], [place] [address]   CLINIC:  Medical Oncology/Hematology  PCP:  Rory Percy, MD [place] [address] [phone]   REASON FOR VISIT: Follow-up for MGUS  CURRENT THERAPY: Observation   INTERVAL HISTORY:  Katie Elliott 42 y.o. female returns for routine follow-up for MGUS.   REVIEW OF SYSTEMS:  Review of Systems - Oncology   PAST MEDICAL/SURGICAL HISTORY:  Past Medical History:  Diagnosis Date  . Chronic sinus infection   . Fibromyalgia    Past Surgical History:  Procedure Laterality Date  . CHOLECYSTECTOMY    . KNEE SURGERY Left   . thumb surgery Right      SOCIAL HISTORY:  Social History   Socioeconomic History  . Marital status: Married    Spouse name: Not on file  . Number of children: Not on file  . Years of education: Not on file  . Highest education level: Not on file  Occupational History  . Not on file  Social Needs  . Financial resource strain: Not on file  . Food insecurity:    Worry: Not on file    Inability: Not on file  . Transportation needs:    Medical: Not on file    Non-medical: Not on file  Tobacco Use  . Smoking status: Never Smoker  . Smokeless tobacco: Never Used  Substance and Sexual Activity  . Alcohol use: Yes    Comment: occasional  . Drug use: No  . Sexual activity: Yes    Birth control/protection: IUD  Lifestyle  . Physical activity:    Days per week: Not on file    Minutes per session: Not on file  . Stress: Not on file  Relationships  . Social connections:    Talks on phone: Not on file    Gets together: Not on file    Attends religious service: Not on file    Active member of club or organization: Not on file    Attends meetings of clubs or organizations: Not on file    Relationship status: Not on file  . Intimate partner violence:    Fear of current or ex partner: Not on file    Emotionally abused: Not on file    Physically abused: Not on file    Forced sexual activity: Not on file  Other Topics Concern  . Not on file  Social History Narrative  . Not on file    FAMILY HISTORY:  Family History  Problem Relation Age of Onset  . Bipolar disorder Mother   . Hypertension Father   . Gout Father   . Irritable bowel syndrome Father   . Scoliosis Sister   . Bipolar disorder Sister   . Eczema Sister   . Asthma Sister   . Hearing loss Sister        has hearing aid     CURRENT MEDICATIONS:  Outpatient Encounter Medications as of 03/21/2018  Medication Sig  . busPIRone (BUSPAR) 10 MG tablet Take 10 mg by mouth as needed (for Anxiety).  . cetirizine (ZYRTEC) 10 MG tablet Take 10 mg by mouth daily.  . cyclobenzaprine (FLEXERIL) 10 MG tablet Take 10 mg by mouth 3 (three) times daily as needed.  . DULoxetine (CYMBALTA) 30 MG capsule Take 60 mg by mouth daily.   . furosemide (LASIX) 20 MG tablet Take 20 mg by mouth as needed.  Marland Kitchen lisinopril-hydrochlorothiazide (PRINZIDE,ZESTORETIC) 20-25 MG tablet Take 1 tablet by mouth  daily.  . Melatonin 10 MG TABS Take by mouth daily.  . naproxen (NAPROSYN) 500 MG tablet Take 500 mg by mouth 2 (two) times daily as needed for moderate pain.  Marland Kitchen omeprazole (PRILOSEC) 20 MG capsule Take 20 mg by mouth daily.   No facility-administered encounter medications on file as of 03/21/2018.     ALLERGIES:  Allergies  Allergen Reactions  . Sulfa Antibiotics Itching and Rash    From childhood     PHYSICAL EXAM:  ECOG Performance status: ***  There were no vitals filed for this visit. There were no vitals filed for this visit.  Physical Exam   LABORATORY DATA:  I have reviewed the labs as listed.  CBC    Component Value Date/Time   WBC 9.7 08/26/2017 0923   RBC 4.90 08/26/2017 0923   HGB 14.4 08/26/2017 0923   HCT 43.6 08/26/2017 0923   PLT 377 08/26/2017 0923   MCV 89.0 08/26/2017 0923   MCH 29.4 08/26/2017 0923   MCHC 33.0 08/26/2017 0923   RDW 12.8 08/26/2017 0923   LYMPHSABS 3.3  08/26/2017 0923   MONOABS 0.7 08/26/2017 0923   EOSABS 0.2 08/26/2017 0923   BASOSABS 0.1 08/26/2017 0923   CMP Latest Ref Rng & Units 08/26/2017 07/03/2017 07/03/2017  Glucose 65 - 99 mg/dL 90 - 88  BUN 6 - 20 mg/dL 9 - 9  Creatinine 0.44 - 1.00 mg/dL 1.00 - 1.07  Sodium 135 - 145 mmol/L 134(L) - 139  Potassium 3.5 - 5.1 mmol/L 3.1(L) - 4.0  Chloride 101 - 111 mmol/L 98(L) - 100  CO2 22 - 32 mmol/L 25 - 29  Calcium 8.9 - 10.3 mg/dL 9.3 - 9.7  Total Protein 6.5 - 8.1 g/dL 7.4 5.6(L) 6.9  Total Bilirubin 0.3 - 1.2 mg/dL 0.8 - 0.8  Alkaline Phos 38 - 126 U/L 74 - -  AST 15 - 41 U/L 34 - 25  ALT 14 - 54 U/L 48 - 36(H)       DIAGNOSTIC IMAGING:  *The following radiologic images and reports have been reviewed independently and agree with below findings.      ASSESSMENT & PLAN:   No problem-specific Assessment & Plan notes found for this encounter.      Orders placed this encounter:  No orders of the defined types were placed in this encounter.     Derek Jack, MD [place] [phone]

## 2018-12-02 DIAGNOSIS — I10 Essential (primary) hypertension: Secondary | ICD-10-CM | POA: Diagnosis not present

## 2018-12-02 DIAGNOSIS — E559 Vitamin D deficiency, unspecified: Secondary | ICD-10-CM | POA: Diagnosis not present

## 2018-12-02 DIAGNOSIS — E7800 Pure hypercholesterolemia, unspecified: Secondary | ICD-10-CM | POA: Insufficient documentation

## 2018-12-02 DIAGNOSIS — R252 Cramp and spasm: Secondary | ICD-10-CM | POA: Diagnosis not present

## 2018-12-02 DIAGNOSIS — M797 Fibromyalgia: Secondary | ICD-10-CM | POA: Diagnosis not present

## 2018-12-04 DIAGNOSIS — Z0000 Encounter for general adult medical examination without abnormal findings: Secondary | ICD-10-CM | POA: Diagnosis not present

## 2018-12-12 ENCOUNTER — Encounter: Payer: Self-pay | Admitting: Gastroenterology

## 2018-12-13 DIAGNOSIS — U071 COVID-19: Secondary | ICD-10-CM | POA: Diagnosis not present

## 2018-12-13 DIAGNOSIS — F329 Major depressive disorder, single episode, unspecified: Secondary | ICD-10-CM | POA: Diagnosis not present

## 2018-12-13 DIAGNOSIS — R197 Diarrhea, unspecified: Secondary | ICD-10-CM | POA: Diagnosis not present

## 2018-12-13 DIAGNOSIS — R0602 Shortness of breath: Secondary | ICD-10-CM | POA: Diagnosis not present

## 2018-12-13 DIAGNOSIS — E876 Hypokalemia: Secondary | ICD-10-CM | POA: Diagnosis not present

## 2018-12-13 DIAGNOSIS — M797 Fibromyalgia: Secondary | ICD-10-CM | POA: Diagnosis not present

## 2019-01-05 DIAGNOSIS — U071 COVID-19: Secondary | ICD-10-CM | POA: Diagnosis not present

## 2019-01-05 DIAGNOSIS — Z20828 Contact with and (suspected) exposure to other viral communicable diseases: Secondary | ICD-10-CM | POA: Diagnosis not present

## 2019-01-06 ENCOUNTER — Encounter: Payer: Self-pay | Admitting: Gastroenterology

## 2019-01-06 NOTE — Progress Notes (Signed)
Referring Provider: Dr. Nadara Mustard Primary Care Physician:  Rory Percy, MD Primary Gastroenterologist:  Dr. Oneida Alar  Chief Complaint  Patient presents with  . Gastroesophageal Reflux    Heartburn and acid reflux during the night     HPI:   Katie Elliott is a 43 y.o. female presenting today at the request of Dr. Nadara Mustard for GERD. Past medical history significant for monoclonal gammopathy, fibromyalgia, HTN, chronic sinus infection.   Today she states she has had reflux since a child. Was on Nexium in 20s. Has been on omeprazole for about 15 years now. Still has heartburn and reflux waking her up at night occasionally. Lost about 15 lbs and hasn't helped. Burning in upper abdomen at times. Burning in upper abdomen and heartburn will happen immediately if she forgets to take omeprazole even before eating or drinking anything. When she doesn't forget her omeprazole, she rarely has heaart burn. Few times a month with reflux coming up into her throat, usually at night. Avoids eating late and over eating as this will trigger reflux. Spicy foods may cause some heartburn. No specific foods causing reflux symptoms. Takes Naproxen daily for fibromyalgia. No dysphagia, no nausea or vomiting.   Reports EGD back in 20s. Reports some "damage or ulcers". Test at [facility].    Some alternating constipation and diarrhea with associated lower abdominal pain that is relieved after bowel movements. Has been going on for years. Dad with IBS. Imodium rarely for diarrhea. If constipated, will increase fiber, which usually helps. If constipation continues, she will take Mag citrate. Occasional minimal spotty tissue hematochezia if her hemorrhoids flare. Will have some associated itching. Tissue is prolapsed out. Hematochezia self limited in a couple days. No melena.   Patient recently tested positive for COVID-19 on 12/10/18. Negative test on 12/29/18. Asymptomatic at this time. Main symptoms went away  around July 1st. Fatigue until 7-8th of July. Never with fever, chills. On eliquis until 27th July.    Past Medical History:  Diagnosis Date  . Chronic sinus infection   . Fibromyalgia   . HTN (hypertension)   . Monoclonal gammopathy     Past Surgical History:  Procedure Laterality Date  . CHOLECYSTECTOMY    . KNEE SURGERY Left   . thumb surgery Right     Current Outpatient Medications  Medication Sig Dispense Refill  . apixaban (ELIQUIS) 5 MG TABS tablet Take 5 mg by mouth 2 (two) times daily.    . busPIRone (BUSPAR) 10 MG tablet Take 10 mg by mouth 2 (two) times a day.     . cyclobenzaprine (FLEXERIL) 10 MG tablet Take 10 mg by mouth as needed.   0  . DULoxetine (CYMBALTA) 30 MG capsule Take 60 mg by mouth daily.     Marland Kitchen lisinopril-hydrochlorothiazide (PRINZIDE,ZESTORETIC) 20-25 MG tablet Take 1 tablet by mouth daily.    . naproxen (NAPROSYN) 500 MG tablet Take 500 mg by mouth as needed for moderate pain.     Marland Kitchen omeprazole (PRILOSEC) 20 MG capsule Take 20 mg by mouth daily.    Marland Kitchen UNABLE TO FIND daily. CBD oil Takes 1 dropper daily    . cetirizine (ZYRTEC) 10 MG tablet Take 10 mg by mouth daily.    . furosemide (LASIX) 20 MG tablet Take 20 mg by mouth as needed.    . Melatonin 10 MG TABS Take by mouth daily.     No current facility-administered medications for this visit.     Allergies as of 01/07/2019 -  Review Complete 01/07/2019  Allergen Reaction Noted  . Sulfa antibiotics Itching and Rash 03/16/2015    Family History  Problem Relation Age of Onset  . Bipolar disorder Mother   . Hypertension Father   . Gout Father   . Irritable bowel syndrome Father   . Scoliosis Sister   . Bipolar disorder Sister   . Eczema Sister   . Asthma Sister   . Hearing loss Sister        has hearing aid     Social History   Socioeconomic History  . Marital status: Married    Spouse name: Not on file  . Number of children: Not on file  . Years of education: Not on file  . Highest  education level: Not on file  Occupational History  . Not on file  Social Needs  . Financial resource strain: Not on file  . Food insecurity    Worry: Not on file    Inability: Not on file  . Transportation needs    Medical: Not on file    Non-medical: Not on file  Tobacco Use  . Smoking status: Never Smoker  . Smokeless tobacco: Never Used  Substance and Sexual Activity  . Alcohol use: Yes    Comment: occasional  . Drug use: No  . Sexual activity: Yes    Birth control/protection: I.U.D.  Lifestyle  . Physical activity    Days per week: Not on file    Minutes per session: Not on file  . Stress: Not on file  Relationships  . Social Herbalist on phone: Not on file    Gets together: Not on file    Attends religious service: Not on file    Active member of club or organization: Not on file    Attends meetings of clubs or organizations: Not on file    Relationship status: Not on file  . Intimate partner violence    Fear of current or ex partner: Not on file    Emotionally abused: Not on file    Physically abused: Not on file    Forced sexual activity: Not on file  Other Topics Concern  . Not on file  Social History Narrative  . Not on file    Review of Systems: Gen: Denies lightheadedness, dizziness, or feeling like she will pass out.  CV: Denies chest pain, heart palpitations. Admits occasional minimal LE swelling in ankles.  Resp: Denies shortness of breath. Denies wheezing or cough.  GI: See HPI GU : Denies urinary burning, urinary frequency, urinary hesitancy MS: Pains associated with fibromyalgia.   Derm: Denies rash, itching, dry skin Psych: Admits depression, anxiety. Takes meds for both. Well controlled.  Heme: Denies bruising, bleeding  Physical Exam: BP (!) 143/82   Pulse 89   Temp (!) 97.2 F (36.2 C)   Ht 5' 6"  (1.676 m)   Wt 213 lb 9.6 oz (96.9 kg)   BMI 34.48 kg/m  General: Alert and oriented. Pleasant and cooperative. Well-nourished  and well-developed.  Head:  Normocephalic and atraumatic. Eyes:  Without icterus, sclera clear and conjunctiva pink.  Ears:  Normal auditory acuity. Nose:  No deformity, discharge,  or lesions. Lungs:  Clear to auscultation bilaterally. No wheezes, rales, or rhonchi. No distress.  Heart:  S1, S2 present without murmurs appreciated.  Abdomen: +BS, soft, non-tender and non-distended. No HSM noted. No guarding or rebound. No masses appreciated.  Rectal:  Deferred  Msk:  Symmetrical without gross deformities.  Normal posture. Pulses:  Normal pulses noted. Extremities:  Without clubbing or edema. Neurologic:  Alert and  oriented x4;  grossly normal neurologically. Skin:  Intact without significant lesions or rashes. Psych:  Alert and cooperative. Normal mood and affect.  Recent Labs 12/03/18:   CBC: WBC 7.9, Hgb 14.7, MCV 89, MHC 28.6, MCHC 32.1, Platelets 379  CMET: BUN 14, Cr 0.87, sodium 140, potassium 3.7, Alk phos 71, AST 24, ALT 33, bilirubin 0.8

## 2019-01-07 ENCOUNTER — Other Ambulatory Visit: Payer: Self-pay

## 2019-01-07 ENCOUNTER — Encounter: Payer: Self-pay | Admitting: Gastroenterology

## 2019-01-07 ENCOUNTER — Ambulatory Visit: Payer: 59 | Admitting: Gastroenterology

## 2019-01-07 VITALS — BP 143/82 | HR 89 | Temp 97.2°F | Ht 66.0 in | Wt 213.6 lb

## 2019-01-07 DIAGNOSIS — K625 Hemorrhage of anus and rectum: Secondary | ICD-10-CM

## 2019-01-07 DIAGNOSIS — K219 Gastro-esophageal reflux disease without esophagitis: Secondary | ICD-10-CM | POA: Diagnosis not present

## 2019-01-07 MED ORDER — FAMOTIDINE 20 MG PO TABS: 20.0000 mg | ORAL_TABLET | Freq: Every evening | ORAL | 5 refills | Status: AC

## 2019-01-07 MED ORDER — PANTOPRAZOLE SODIUM 40 MG PO TBEC: 40.0000 mg | DELAYED_RELEASE_TABLET | Freq: Every day | ORAL | 3 refills | Status: DC

## 2019-01-07 NOTE — Assessment & Plan Note (Signed)
43 y.o. female with past medical history significant for monoclonal gammopathy, fibromyalgia, and HTN

## 2019-01-07 NOTE — Patient Instructions (Addendum)
1. Start taking Protonix 40 mg daily 30 minutes before breakfast.   2. Start taking pepcid 20 mg every night before bed.  Call in a couple weeks to let us know how you are doing.   3. Please try to avoid taking Naproxen if possible. Also try to avoid taking ibuprofen, alive, advil, and goody's powders as these medications can affect the protective barrier in your stomach. Try using tylenol instead.   4. Continue weight loss efforts.   5. Avoid eating large meals and within 3 hours of laying down.   6. We will follow-up in 6-8 weeks.   Aliene Altes, PA-C [facility]

## 2019-01-07 NOTE — Assessment & Plan Note (Addendum)
Patient reporting occasional minimal spotty tissue hematochezia if her hemorrhoids flare. This is usually if she has diarrhea. Will have some associated itching. Tissue is prolapsed. Self limited in a couple days. No melena. Suspect bleeding is secondary to hemorrhoids; however, will discuss colonoscopy with patient at office visit in 6-8 weeks.

## 2019-01-07 NOTE — Assessment & Plan Note (Addendum)
43 y.o. female with chronic history of GERD. Taking omeprazole 20 mg daily for 15 years. With omeprazole, will have heartburn and epigastric burning rarely and reflux a few times a month, usually at night. If she misses taking omeprazole in the morning, she will have symptoms "almost immediately." Lost about 15 lbs without any improvement, but BMI is still elevated at 34. Continues to avoid eating late at night or over eating. No N/V, dysphagia, melena, or abdominal pain. No abdominal pain on exam. Naproxen daily. EGD in 20's at [facility], reports some "damage or ulcers."  Recent labs in June without anemia. No alarm features to warrant an endoscopy at this time. If no improvement with medication changes, consider EGD at that time.  Start Protonix 40 mg daily 30 minutes before breakfast Start taking Pepcid 20 mg nightly before bed.  Call in 2 weeks to let us know how you are doing.  Continue weight loss efforts. Do not eat within 3 hours of laying down.  Avoid taking Naproxen and other NSAIDs. Try tylenol instead.   Will follow-up in office in 6-8 weeks.   Requesting EGD report from [facility]

## 2019-01-07 NOTE — Progress Notes (Signed)
CC'D TO PCP °

## 2019-02-18 ENCOUNTER — Encounter: Payer: Self-pay | Admitting: Gastroenterology

## 2019-03-10 DIAGNOSIS — Z6833 Body mass index (BMI) 33.0-33.9, adult: Secondary | ICD-10-CM | POA: Diagnosis not present

## 2019-03-10 DIAGNOSIS — B029 Zoster without complications: Secondary | ICD-10-CM | POA: Diagnosis not present

## 2019-03-10 NOTE — Progress Notes (Deleted)
Referring Provider: Rory Percy, MD Primary Care Physician:  Rory Percy, MD Primary GI Physician: Dr. Oneida Alar  No chief complaint on file.   HPI:   Katie Elliott is a 43 y.o. female presenting today for follow-up of GERD and rectal bleeding.  Patient was last in the office on 01/07/2019 for initial consult for GERD.  Patient reported long history of reflux since she was a child.  Had been on Nexium in the distant past and omeprazole for 15 years.  Continued with occasional heartburn or reflux waking her up at night.  Had initiated dietary/lifestyle changes and weight loss efforts without any significant improvement although BMI remained elevated.  Had reported EGD in her 31s at [place].  Requested records.  Plan at that time to stop omeprazole and start Protonix 40 mg daily as well as Pepcid 20 mg nightly before bed.  Advise she avoid naproxen or other NSAIDs. Patient was to call in 2 weeks with a progress report but she did not.  She had also reported intermittent tissue hematochezia which she attributed to hemorrhoid flares secondary to occasional diarrhea.  Plans to discuss colonoscopy at next visit.   Today she states  Past Medical History:  Diagnosis Date  . Chronic sinus infection   . Fibromyalgia   . HTN (hypertension)   . Monoclonal gammopathy     Past Surgical History:  Procedure Laterality Date  . CHOLECYSTECTOMY    . ESOPHAGOGASTRODUODENOSCOPY  05/30/2004   Surgeon Dr. Gar Ponto: Normal esophagus, 4 cm hiatal hernia, normal stomach, normal duodenum.   Marland Kitchen KNEE SURGERY Left   . thumb surgery Right     Current Outpatient Medications  Medication Sig Dispense Refill  . apixaban (ELIQUIS) 5 MG TABS tablet Take 5 mg by mouth 2 (two) times daily.    . busPIRone (BUSPAR) 10 MG tablet Take 10 mg by mouth 2 (two) times a day.     . cyclobenzaprine (FLEXERIL) 10 MG tablet Take 10 mg by mouth as needed.   0  . DULoxetine (CYMBALTA) 30 MG capsule Take 60 mg by  mouth daily.     . famotidine (PEPCID) 20 MG tablet Take 1 tablet (20 mg total) by mouth Nightly. 30 tablet 5  . lisinopril-hydrochlorothiazide (PRINZIDE,ZESTORETIC) 20-25 MG tablet Take 1 tablet by mouth daily.    . naproxen (NAPROSYN) 500 MG tablet Take 500 mg by mouth as needed for moderate pain.     Marland Kitchen omeprazole (PRILOSEC) 20 MG capsule Take 20 mg by mouth daily.    . pantoprazole (PROTONIX) 40 MG tablet Take 1 tablet (40 mg total) by mouth daily before breakfast. 90 tablet 3  . UNABLE TO FIND daily. CBD oil Takes 1 dropper daily     No current facility-administered medications for this visit.     Allergies as of 03/11/2019 - Review Complete 01/07/2019  Allergen Reaction Noted  . Sulfa antibiotics Itching and Rash 03/16/2015    Family History  Problem Relation Age of Onset  . Bipolar disorder Mother   . Colon polyps Mother   . Hypertension Father   . Gout Father   . Irritable bowel syndrome Father   . Scoliosis Sister   . Bipolar disorder Sister   . Eczema Sister   . Asthma Sister   . Hearing loss Sister        has hearing aid   . Colon polyps Maternal Uncle        requiring part of colon to be removed.  Social History   Socioeconomic History  . Marital status: Married    Spouse name: Not on file  . Number of children: Not on file  . Years of education: Not on file  . Highest education level: Not on file  Occupational History  . Not on file  Social Needs  . Financial resource strain: Not on file  . Food insecurity    Worry: Not on file    Inability: Not on file  . Transportation needs    Medical: Not on file    Non-medical: Not on file  Tobacco Use  . Smoking status: Never Smoker  . Smokeless tobacco: Never Used  Substance and Sexual Activity  . Alcohol use: Yes    Comment:  occasionally   . Drug use: No  . Sexual activity: Yes    Birth control/protection: I.U.D.  Lifestyle  . Physical activity    Days per week: Not on file    Minutes per session:  Not on file  . Stress: Not on file  Relationships  . Social Herbalist on phone: Not on file    Gets together: Not on file    Attends religious service: Not on file    Active member of club or organization: Not on file    Attends meetings of clubs or organizations: Not on file    Relationship status: Not on file  Other Topics Concern  . Not on file  Social History Narrative  . Not on file    Review of Systems: Gen: Denies fever, chills, anorexia. Denies fatigue, weakness, weight loss.  CV: Denies chest pain, palpitations, syncope, peripheral edema, and claudication. Resp: Denies dyspnea at rest, cough, wheezing, coughing up blood, and pleurisy. GI: Denies vomiting blood, jaundice, and fecal incontinence.   Denies dysphagia or odynophagia. Derm: Denies rash, itching, dry skin Psych: Denies depression, anxiety, memory loss, confusion. No homicidal or suicidal ideation.  Heme: Denies bruising, bleeding, and enlarged lymph nodes.  Physical Exam: There were no vitals taken for this visit. General:   Alert and oriented. No distress noted. Pleasant and cooperative.  Head:  Normocephalic and atraumatic. Eyes:  Conjuctiva clear without scleral icterus. Mouth:  Oral mucosa pink and moist. Good dentition. No lesions. Heart:  S1, S2 present without murmurs appreciated. Lungs:  Clear to auscultation bilaterally. No wheezes, rales, or rhonchi. No distress.  Abdomen:  +BS, soft, non-tender and non-distended. No rebound or guarding. No HSM or masses noted. Msk:  Symmetrical without gross deformities. Normal posture. Extremities:  Without edema. Neurologic:  Alert and  oriented x4 Psych:  Alert and cooperative. Normal mood and affect.

## 2019-03-11 ENCOUNTER — Ambulatory Visit: Payer: BLUE CROSS/BLUE SHIELD | Admitting: Gastroenterology

## 2019-03-11 ENCOUNTER — Encounter: Payer: Self-pay | Admitting: Gastroenterology

## 2019-12-02 IMAGING — DX DG BONE SURVEY MET
8 of 10 series · 8 of 10 positions shown · non-contrast
Comparison: None.

CLINICAL DATA: Monoclonal gammopathy

EXAM:
METASTATIC BONE SURVEY

[skull lat]
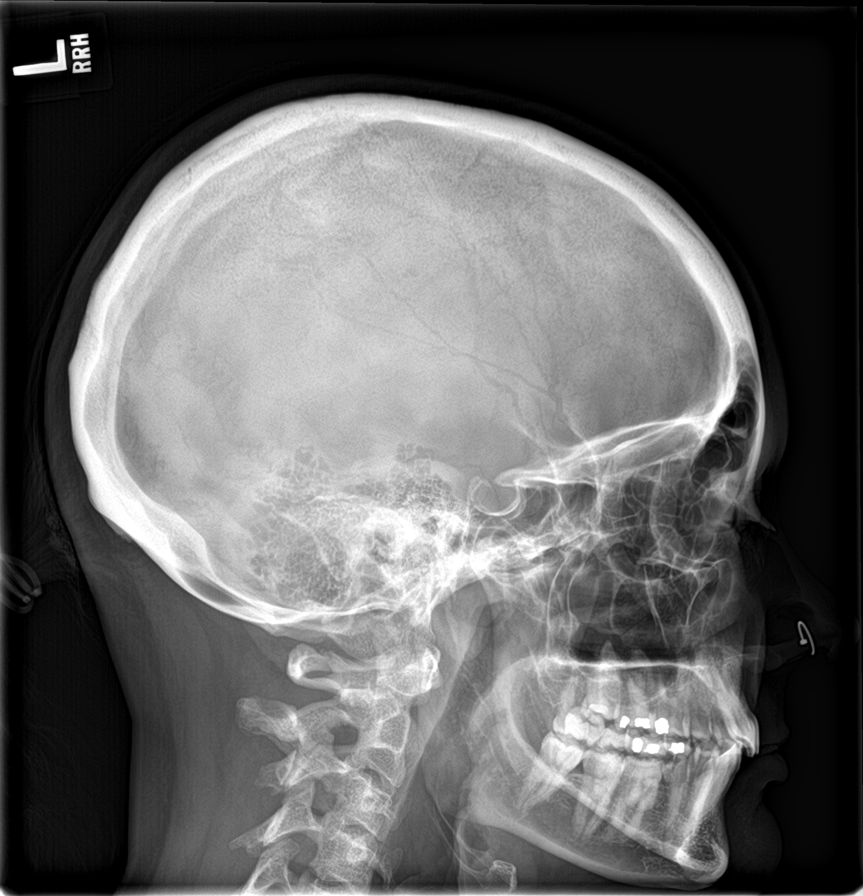

[shoulder ap (1 of 2)]
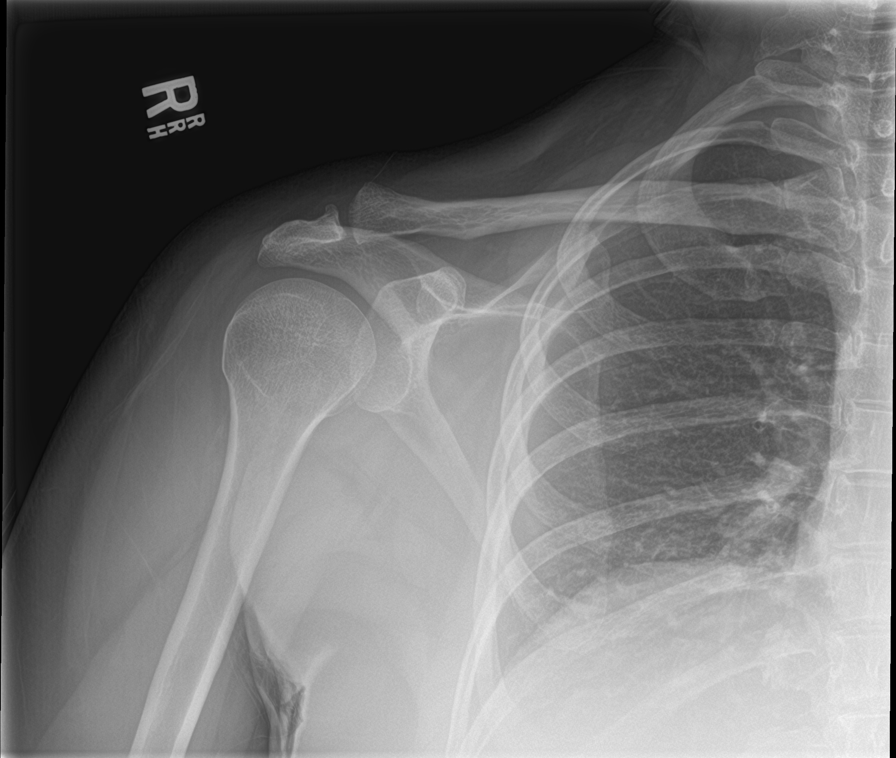

[shoulder ap (2 of 2)]
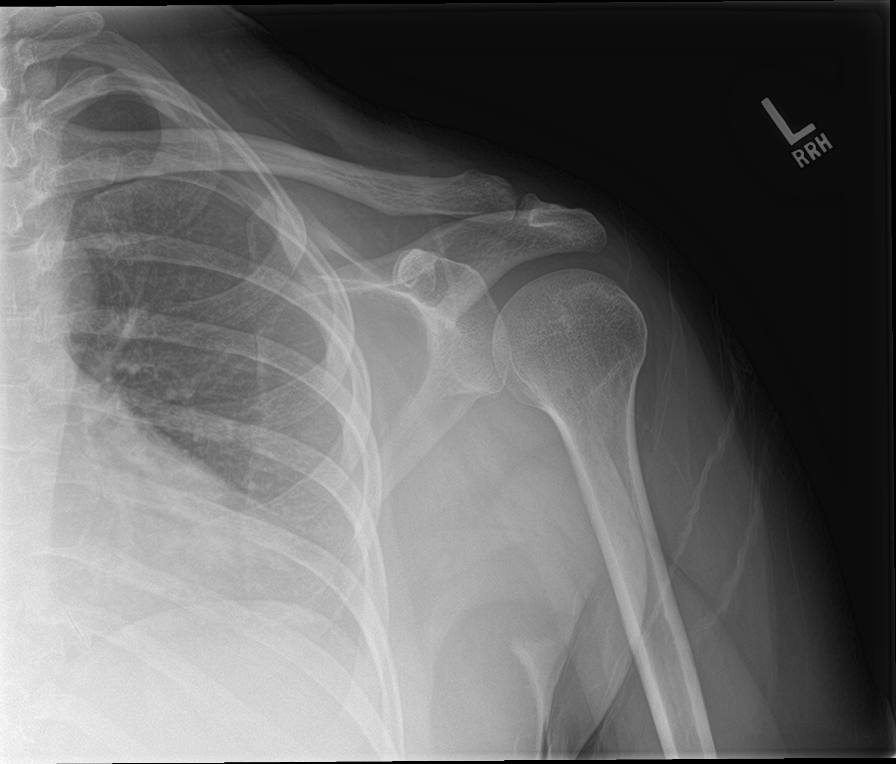

[humerus ap (1 of 2)]
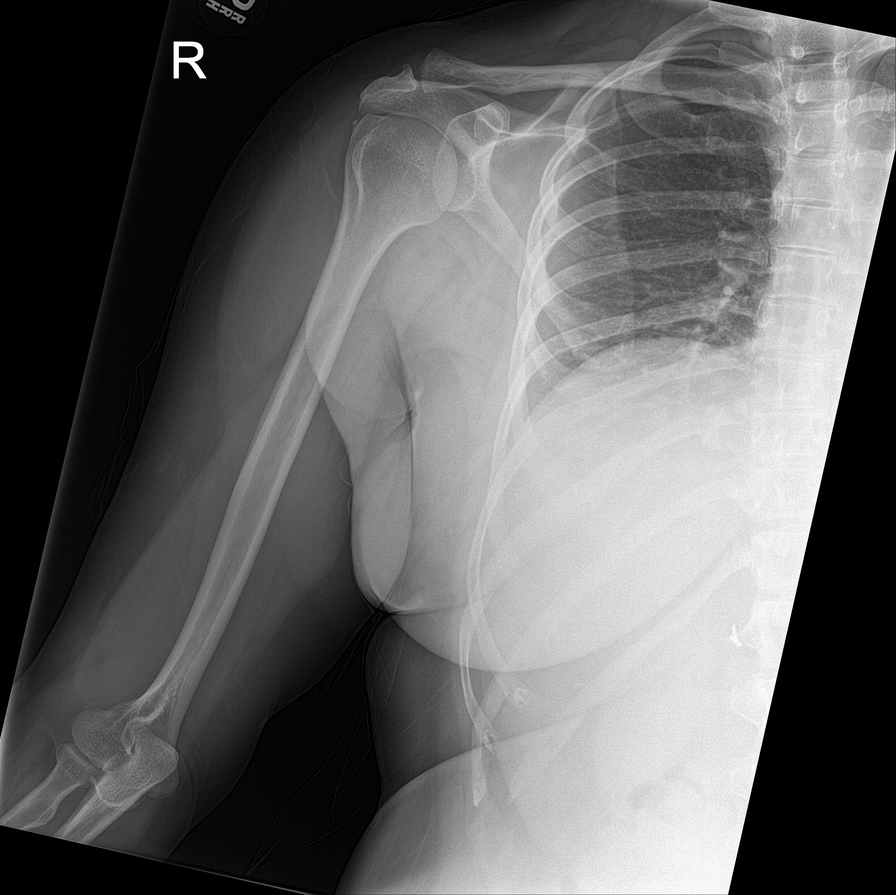

[humerus ap (2 of 2)]
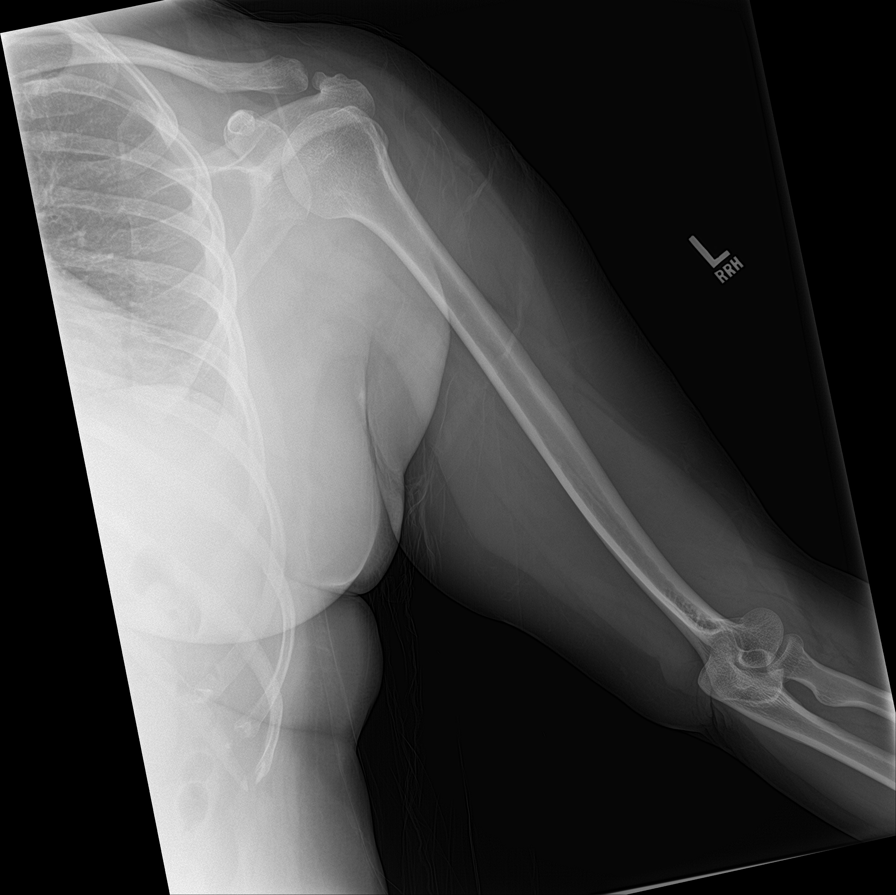

[forearm ap (1 of 2)]
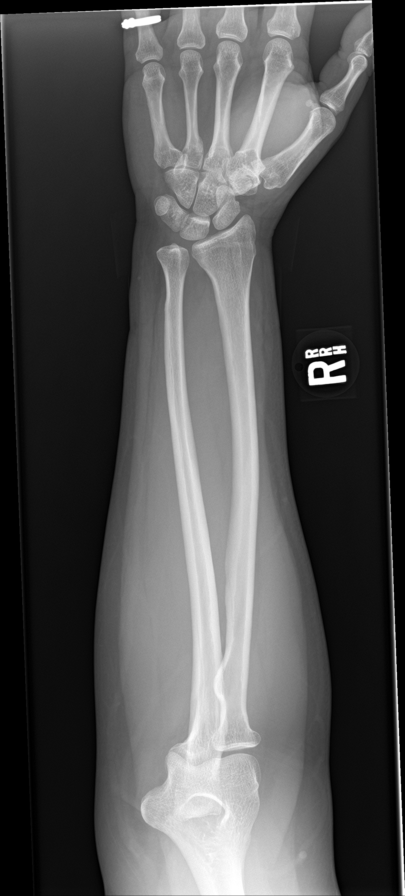

[forearm ap (2 of 2)]
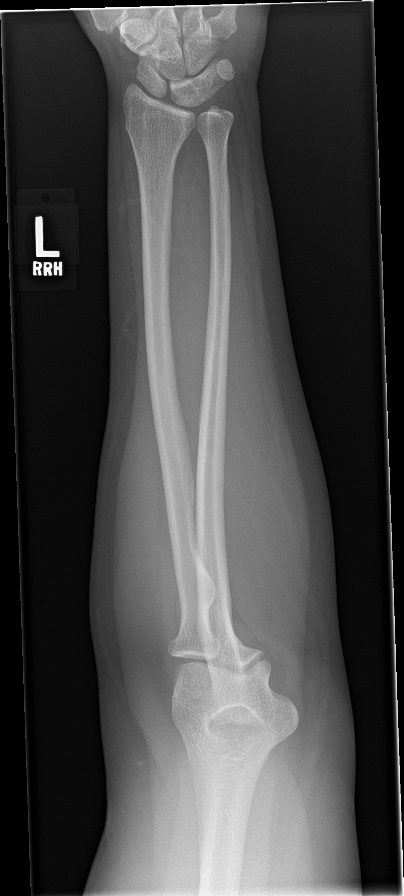

[c-spine ap]
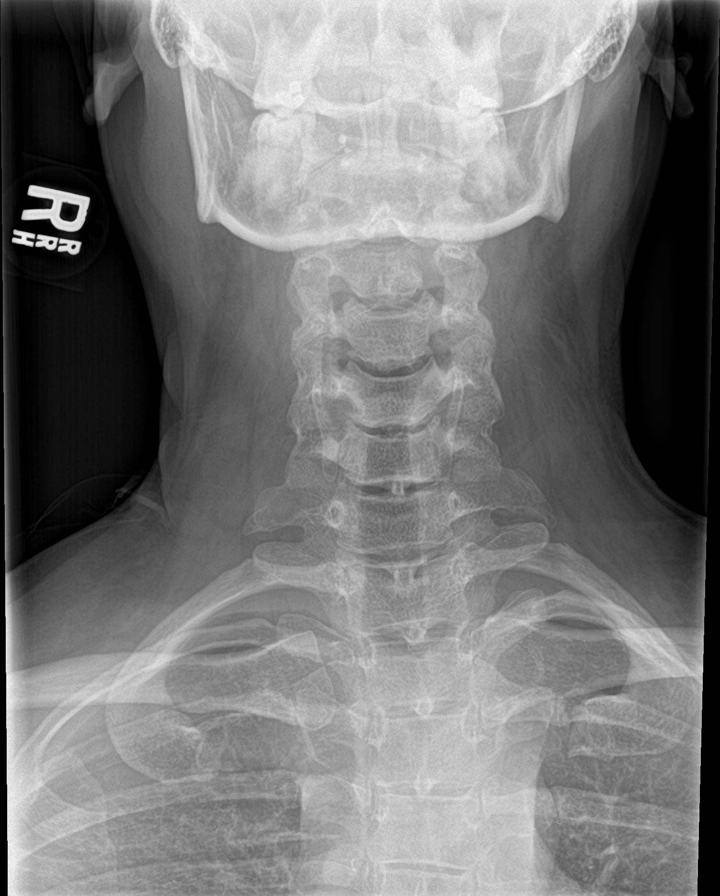

[8 of 10 positions shown; findings below may reference images not displayed]

FINDINGS: Chest: Lungs are clear. Cardiac shadow is within normal limits. Bony
structures show no lytic or sclerotic lesions.

Lateral view of the skull demonstrates no significant lytic lesions.

Bilateral shoulders demonstrate degenerative change of the
acromioclavicular joint right slightly greater than left. No acute
fracture or dislocation is noted. No lytic lesions are seen.

Bilateral upper extremities: No lytic or sclerotic lesions are
noted.

Cervical spine: Degenerative changes are noted at C5-6 and C6-7. No
soft tissue abnormality is noted. No lytic lesions are seen.

Thoracic spine: Mild degenerative changes are noted. No compression
deformities are seen. No lytic or sclerotic lesions are noted.

Lumbar spine: Mild degenerative changes are noted at L5-S1. No lytic
or sclerotic lesions are noted.

Pelvis: IUD is noted in place. No lytic or sclerotic lesions are
noted.

Lower extremities: No lytic or sclerotic lesions are noted. Mild
degenerative changes of the knee joints are noted bilaterally.
IMPRESSION: No lytic or sclerotic lesions are noted. Scattered degenerative
changes are seen as described.

## 2019-12-26 ENCOUNTER — Other Ambulatory Visit: Payer: Self-pay | Admitting: Gastroenterology

## 2019-12-26 DIAGNOSIS — K219 Gastro-esophageal reflux disease without esophagitis: Secondary | ICD-10-CM

## 2021-05-04 ENCOUNTER — Emergency Department (HOSPITAL_COMMUNITY)
Admission: EM | Admit: 2021-05-04 | Discharge: 2021-05-04 | Disposition: A | Discharge status: Home / Self Care | Payer: 59 | Attending: Emergency Medicine | Admitting: Emergency Medicine

## 2021-05-04 ENCOUNTER — Encounter (HOSPITAL_COMMUNITY): Payer: Self-pay | Admitting: *Deleted

## 2021-05-04 DIAGNOSIS — R42 Dizziness and giddiness: Secondary | ICD-10-CM | POA: Diagnosis present

## 2021-05-04 DIAGNOSIS — Z7901 Long term (current) use of anticoagulants: Secondary | ICD-10-CM | POA: Diagnosis not present

## 2021-05-04 DIAGNOSIS — Z79899 Other long term (current) drug therapy: Secondary | ICD-10-CM | POA: Diagnosis not present

## 2021-05-04 DIAGNOSIS — I10 Essential (primary) hypertension: Secondary | ICD-10-CM | POA: Insufficient documentation

## 2021-05-04 DIAGNOSIS — H81399 Other peripheral vertigo, unspecified ear: Secondary | ICD-10-CM

## 2021-05-04 HISTORY — DX: Gastro-esophageal reflux disease without esophagitis: K21.9

## 2021-05-04 MED ORDER — DEXAMETHASONE SODIUM PHOSPHATE 10 MG/ML IJ SOLN
10.0000 mg | Freq: Once | INTRAMUSCULAR | Status: AC
  Administered 2021-05-04: 10 mg via INTRAMUSCULAR
  Filled 2021-05-04: qty 1

## 2021-05-04 MED ORDER — METOCLOPRAMIDE HCL 10 MG PO TABS: 10.0000 mg | ORAL_TABLET | Freq: Three times a day (TID) | ORAL | 0 refills | Status: AC | PRN

## 2021-05-04 NOTE — ED Triage Notes (Signed)
Vertigo onset 5 days ago, seen at urgent care and given meclizine and zofran without relief

## 2021-05-04 NOTE — ED Provider Notes (Signed)
[facility] EMERGENCY DEPARTMENT Provider Note   CSN: [id number] Arrival date & time: 05/04/21  1517     History Chief Complaint  Patient presents with   Dizziness    Katie Elliott is a 45 y.o. female.   Dizziness  This patient is a 45 year old female, presenting to the hospital today with a complaint of dizziness which she describes as the room spinning.  She reports that this has been going on for at least the last several days, she had been seen at the urgent care and was given meclizine and Zofran but states it has not really helped.  She knows that she can find a completely asymptomatic position when she is laying back at about 30 degrees head elevated however when she tries to move it all it causes her symptoms to come on.  This all started while she was doing her hair, she put her head back and when this happened she had acute onset of symptoms.  She has had vertigo a couple of times in the past but it was very short-lived.  This 1 has been going on for several days.  She denies any other neurologic symptoms including changes in speech, changes in coordination, changes in her ability to use arms or legs.  There is no numbness or weakness.  No chest pain coughing or shortness of breath, no fevers chills however she does get nauseated when the vertigo happens  Past Medical History:  Diagnosis Date   Acid reflux    Chronic sinus infection    Fibromyalgia    HTN (hypertension)    Monoclonal gammopathy     Patient Active Problem List   Diagnosis Date Noted   GERD (gastroesophageal reflux disease) 01/07/2019   Rectal bleeding 01/07/2019   Essential hypertension 07/02/2017   Gastroesophageal reflux disease with esophagitis 07/02/2017   Allergic rhinitis 07/02/2017    Past Surgical History:  Procedure Laterality Date   CHOLECYSTECTOMY     ESOPHAGOGASTRODUODENOSCOPY  05/30/2004   Surgeon Dr. Gar Ponto: Normal esophagus, 4 cm hiatal hernia, normal stomach, normal  duodenum.    KNEE SURGERY Left    thumb surgery Right      OB History   No obstetric history on file.     Family History  Problem Relation Age of Onset   Bipolar disorder Mother    Colon polyps Mother    Hypertension Father    Gout Father    Irritable bowel syndrome Father    Scoliosis Sister    Bipolar disorder Sister    Eczema Sister    Asthma Sister    Hearing loss Sister        has hearing aid    Colon polyps Maternal Uncle        requiring part of colon to be removed.     Social History   Tobacco Use   Smoking status: Never   Smokeless tobacco: Never  Vaping Use   Vaping Use: Never used  Substance Use Topics   Alcohol use: Yes    Comment:  occasionally    Drug use: No    Home Medications Prior to Admission medications   Medication Sig Start Date End Date Taking? Authorizing Provider  metoCLOPramide (REGLAN) 10 MG tablet Take 1 tablet (10 mg total) by mouth every 8 (eight) hours as needed for nausea (dizziness). 05/04/21  Yes Noemi Chapel, MD  apixaban (ELIQUIS) 5 MG TABS tablet Take 5 mg by mouth 2 (two) times daily.    [provider]  busPIRone (BUSPAR) 10 MG tablet Take 10 mg by mouth 2 (two) times a day.     [provider]  cyclobenzaprine (FLEXERIL) 10 MG tablet Take 10 mg by mouth as needed.  04/19/15   [provider]  DULoxetine (CYMBALTA) 30 MG capsule Take 60 mg by mouth daily.     [provider]  famotidine (PEPCID) 20 MG tablet Take 1 tablet (20 mg total) by mouth Nightly. 01/07/19   Erenest Rasher, PA-C  lisinopril-hydrochlorothiazide (PRINZIDE,ZESTORETIC) 20-25 MG tablet Take 1 tablet by mouth daily.    [provider]  naproxen (NAPROSYN) 500 MG tablet Take 500 mg by mouth as needed for moderate pain.     [provider]  omeprazole (PRILOSEC) 20 MG capsule Take 20 mg by mouth daily.    [provider]  pantoprazole (PROTONIX) 40 MG tablet TAKE 1 TABLET BY MOUTH DAILY BEFORE  BREAKFAST 12/29/19   Mahala Menghini, PA-C  UNABLE TO FIND daily. CBD oil Takes 1 dropper daily    [provider]    Allergies    Sulfa antibiotics  Review of Systems   Review of Systems  Neurological:  Positive for dizziness.  All other systems reviewed and are negative.  Physical Exam Updated Vital Signs BP 135/81 (BP Location: Right Arm)   Pulse (!) 102   Temp 98 F (36.7 C) (Oral)   Resp 16   Ht 1.676 m (5\' 6" )   Wt 99.8 kg   SpO2 100%   BMI 35.51 kg/m   Physical Exam Vitals and nursing note reviewed.  Constitutional:      General: She is not in acute distress.    Appearance: She is well-developed.  HENT:     Head: Normocephalic and atraumatic.     Ears:     Comments: Tympanic membrane's are totally clear bilateral    Mouth/Throat:     Pharynx: No oropharyngeal exudate.  Eyes:     General: No scleral icterus.       Right eye: No discharge.        Left eye: No discharge.     Conjunctiva/sclera: Conjunctivae normal.     Pupils: Pupils are equal, round, and reactive to light.  Neck:     Thyroid: No thyromegaly.     Vascular: No JVD.  Cardiovascular:     Rate and Rhythm: Normal rate and regular rhythm.     Heart sounds: Normal heart sounds. No murmur heard.   No friction rub. No gallop.  Pulmonary:     Effort: Pulmonary effort is normal. No respiratory distress.     Breath sounds: Normal breath sounds. No wheezing or rales.  Abdominal:     General: Bowel sounds are normal. There is no distension.     Palpations: Abdomen is soft. There is no mass.     Tenderness: There is no abdominal tenderness.  Musculoskeletal:        General: No tenderness. Normal range of motion.     Cervical back: Normal range of motion and neck supple.  Lymphadenopathy:     Cervical: No cervical adenopathy.  Skin:    General: Skin is warm and dry.     Findings: No erythema or rash.  Neurological:     Mental Status: She is alert.     Coordination: Coordination normal.      Comments: Speech is clear, cranial nerves III through XII are intact, memory is intact, strength is normal in all 4  extremities including grips, sensation is intact to light touch and pinprick in all 4 extremities. Coordination as tested by finger-nose-finger is normal, no limb ataxia. Normal gait, normal reflexes at the patellar tendons bilaterally  Psychiatric:        Behavior: Behavior normal.    ED Results / Procedures / Treatments   Labs (all labs ordered are listed, but only abnormal results are displayed) Labs Reviewed - No data to display  EKG None  Radiology No results found.  Procedures Procedures   Medications Ordered in ED Medications  dexamethasone (DECADRON) injection 10 mg (has no administration in time range)    ED Course  I have reviewed the triage vital signs and the nursing notes.  Pertinent labs & imaging results that were available during my care of the patient were reviewed by me and considered in my medical decision making (see chart for details).    MDM Rules/Calculators/A&P                           This exam is totally reassuring, I suspect that she has peripheral vertigo, she will be given Decadron and prescribed metoclopramide in case this helps a little bit as well.  The patient is agreeable to try but understands that she will likely benefit from following up with ear nose and throat  The patient was given Decadron here, she understands indications for return.  Final Clinical Impression(s) / ED Diagnoses Final diagnoses:  Peripheral vertigo, unspecified laterality    Rx / DC Orders ED Discharge Orders          Ordered    metoCLOPramide (REGLAN) 10 MG tablet  Every 8 hours PRN        05/04/21 1634             Noemi Chapel, MD 05/04/21 1636

## 2021-05-04 NOTE — Discharge Instructions (Signed)
Metoclopramide 10 mg by mouth every 8 hours as needed for nausea or vertigo, you may continue the meclizine, if you start to have stiffness or severe agitation while taking these medications take Benadryl and come back to the emergency department.  I have given you referral to an ear nose and throat doctor, Dr. Benjamine Mola, please call to make an appointment to be seen within the next week

## 2021-06-27 ENCOUNTER — Ambulatory Visit (INDEPENDENT_AMBULATORY_CARE_PROVIDER_SITE_OTHER): Payer: 59 | Admitting: Neurology

## 2021-06-27 ENCOUNTER — Other Ambulatory Visit: Payer: Self-pay

## 2021-06-27 ENCOUNTER — Encounter: Payer: Self-pay | Admitting: Neurology

## 2021-06-27 VITALS — BP 130/88 | HR 101 | Ht 66.0 in | Wt 218.5 lb

## 2021-06-27 DIAGNOSIS — H8110 Benign paroxysmal vertigo, unspecified ear: Secondary | ICD-10-CM

## 2021-06-27 DIAGNOSIS — R42 Dizziness and giddiness: Secondary | ICD-10-CM | POA: Diagnosis not present

## 2021-06-27 NOTE — Patient Instructions (Signed)
MRI Brain with and without contrast to look for etiology of dizziness and patient also with history of Chiari I malformation  Vestibular therapy  Follow up with PCP

## 2021-06-27 NOTE — Progress Notes (Signed)
[facility]  PATIENT: Katie Elliott DOB: December 15, 1975  REQUESTING CLINICIAN: Flatonia Nation, MD HISTORY FROM: Patient  REASON FOR VISIT: Vertigo/Dizziness    HISTORICAL  CHIEF COMPLAINT:  Chief Complaint  Patient presents with   New Patient (Initial Visit)    Rm 15. 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./Recurrent vertigo and headaches Pt states on the days she has vertigo it is accompanied by a headache.    HISTORY OF PRESENT ILLNESS:  This is a 46 year old woman past medical history of fibromyalgia, IBS, recurrent sinus infection who is presenting for evaluation of her vertigo.  Patient report onset of dizziness/vertigo on November 5.  She reported she was blow drying her hair when she developed severe vertigo described as room spinning associated with nausea and vomiting.  She presented to the ED receive Reglan and meclizine without clear improvement of her dizziness.  Patient reports since then, she has bouts of dizziness and usually they can last up to 1 week.  The last episode lasted from December 15 to the 25th.  During this time she will have dizziness described as being unsteady and worse with bending forward and turning head too fast.  Denies any hearing loss, denies any ringing in the ear.  She has a history of recurrent sinus infection but denies any ear or sinus infection lately.  She has not tried vestibular therapy, she has not seen ENT.  She has been seeing a chiropractor with some minimal relief of the dizziness.  She reported yesterday morning she woke up with dizziness, she took Reglan which helped.   She also report of history of headache, described like a bandlike headache that is sometimes associated with the dizziness.  Per chart review she had a MRI in 2017 with borderline Chiari I malformation.     OTHER MEDICAL CONDITIONS: Fibromyalgia, IBS, Recurrent sinus infection    REVIEW OF SYSTEMS: Full 14 system  review of systems performed and negative with exception of: as noted in the HPI  ALLERGIES: Allergies  Allergen Reactions   Sulfa Antibiotics Itching and Rash    From childhood   Tape Rash    HOME MEDICATIONS: Outpatient Medications Prior to Visit  Medication Sig Dispense Refill   busPIRone (BUSPAR) 10 MG tablet Take 10 mg by mouth as needed (for Anxiety).     cyclobenzaprine (FLEXERIL) 10 MG tablet Take 10 mg by mouth as needed.   0   DULoxetine (CYMBALTA) 60 MG capsule Take 60 mg by mouth daily.      famotidine (PEPCID) 20 MG tablet Take 1 tablet (20 mg total) by mouth Nightly. 30 tablet 5   lisinopril-hydrochlorothiazide (PRINZIDE,ZESTORETIC) 20-25 MG tablet Take 1 tablet by mouth daily.     metoCLOPramide (REGLAN) 10 MG tablet Take 1 tablet (10 mg total) by mouth every 8 (eight) hours as needed for nausea (dizziness). 30 tablet 0   naproxen (NAPROSYN) 500 MG tablet Take 500 mg by mouth as needed for moderate pain.      omeprazole (PRILOSEC) 20 MG capsule Take 20 mg by mouth daily.     UNABLE TO FIND daily. CBD oil Takes 1 dropper daily     omeprazole (PRILOSEC) 20 MG capsule Take 1 capsule by mouth daily.     apixaban (ELIQUIS) 5 MG TABS tablet Take 5 mg by mouth 2 (two) times daily.     pantoprazole (PROTONIX) 40 MG tablet TAKE 1 TABLET BY MOUTH DAILY BEFORE BREAKFAST 90 tablet 3   No facility-administered  medications prior to visit.    PAST MEDICAL HISTORY: Past Medical History:  Diagnosis Date   Acid reflux    Chronic sinus infection    Fibromyalgia    HTN (hypertension)    Monoclonal gammopathy     PAST SURGICAL HISTORY: Past Surgical History:  Procedure Laterality Date   CHOLECYSTECTOMY     ESOPHAGOGASTRODUODENOSCOPY  05/30/2004   Surgeon Dr. Gar Ponto: Normal esophagus, 4 cm hiatal hernia, normal stomach, normal duodenum.    KNEE SURGERY Left    thumb surgery Right     FAMILY HISTORY: Family History  Problem Relation Age of Onset   Bipolar disorder  Mother    Colon polyps Mother    Hypertension Father    Gout Father    Irritable bowel syndrome Father    Scoliosis Sister    Bipolar disorder Sister    Eczema Sister    Asthma Sister    Hearing loss Sister        has hearing aid    Colon polyps Maternal Uncle        requiring part of colon to be removed.     SOCIAL HISTORY: Social History   Socioeconomic History   Marital status: Married    Spouse name: Not on file   Number of children: Not on file   Years of education: Not on file   Highest education level: Not on file  Occupational History   Not on file  Tobacco Use   Smoking status: Never   Smokeless tobacco: Never  Vaping Use   Vaping Use: Never used  Substance and Sexual Activity   Alcohol use: Yes    Comment:  occasionally    Drug use: No   Sexual activity: Yes    Birth control/protection: I.U.D.  Other Topics Concern   Not on file  Social History Narrative   Not on file   Social Determinants of Health   Financial Resource Strain: Not on file  Food Insecurity: Not on file  Transportation Needs: Not on file  Physical Activity: Not on file  Stress: Not on file  Social Connections: Not on file  Intimate Partner Violence: Not on file     PHYSICAL EXAM GENERAL EXAM/CONSTITUTIONAL: Vitals:  Vitals:   06/27/21 1512  BP: 130/88  Pulse: (!) 101  Weight: 218 lb 8 oz (99.1 kg)  Height: 5\' 6"  (1.676 m)   Body mass index is 35.27 kg/m. Wt Readings from Last 3 Encounters:  06/27/21 218 lb 8 oz (99.1 kg)  05/04/21 220 lb (99.8 kg)  01/07/19 213 lb 9.6 oz (96.9 kg)   Patient is in no distress; well developed, nourished and groomed; neck is supple  CARDIOVASCULAR: Examination of carotid arteries is normal; no carotid bruits Regular rate and rhythm, no murmurs Examination of peripheral vascular system by observation and palpation is normal  EYES: Pupils round and reactive to light, Visual fields full to confrontation, Extraocular movements intacts,    MUSCULOSKELETAL: Gait, strength, tone, movements noted in Neurologic exam below  NEUROLOGIC: MENTAL STATUS:  No flowsheet data found. awake, alert, oriented to person, place and time recent and remote memory intact normal attention and concentration language fluent, comprehension intact, naming intact fund of knowledge appropriate  CRANIAL NERVE:  2nd, 3rd, 4th, 6th - pupils equal and reactive to light, visual fields full to confrontation, extraocular muscles intact, no nystagmus 5th - facial sensation symmetric 7th - facial strength symmetric 8th - hearing intact 9th - palate elevates symmetrically, uvula midline 11th -  shoulder shrug symmetric 12th - tongue protrusion midline  MOTOR:  normal bulk and tone, full strength in the BUE, BLE  SENSORY:  normal and symmetric to light touch  COORDINATION:  finger-nose-finger, fine finger movements normal  REFLEXES:  deep tendon reflexes present and symmetric  GAIT/STATION:  normal    DIAGNOSTIC DATA (LABS, IMAGING, TESTING) - I reviewed patient records, labs, notes, testing and imaging myself where available.  Lab Results  Component Value Date   WBC 9.7 08/26/2017   HGB 14.4 08/26/2017   HCT 43.6 08/26/2017   MCV 89.0 08/26/2017   PLT 377 08/26/2017      Component Value Date/Time   NA 134 (L) 08/26/2017 0923   K 3.1 (L) 08/26/2017 0923   CL 98 (L) 08/26/2017 0923   CO2 25 08/26/2017 0923   GLUCOSE 90 08/26/2017 0923   BUN 9 08/26/2017 0923   CREATININE 1.00 08/26/2017 0923   CREATININE 1.07 07/03/2017 0953   CALCIUM 9.3 08/26/2017 0923   PROT 7.4 08/26/2017 0923   ALBUMIN 4.1 08/26/2017 0923   AST 34 08/26/2017 0923   ALT 48 08/26/2017 0923   ALKPHOS 74 08/26/2017 0923   BILITOT 0.8 08/26/2017 0923   GFRNONAA >60 08/26/2017 0923   GFRNONAA 64 07/03/2017 0953   GFRAA >60 08/26/2017 0923   GFRAA 74 07/03/2017 0953   No results found for: CHOL, HDL, LDLCALC, LDLDIRECT, TRIG, CHOLHDL No results found  for: HGBA1C No results found for: VITAMINB12 Lab Results  Component Value Date   TSH 2.61 07/03/2017    MRI 2017 Mild tonsillar ectopia without features suggestive of Chiari I malformation. No hydrocephalus or intracranial mass. Minor white matter disease, nonspecific    ASSESSMENT AND PLAN  46 y.o. year old female with past medical history of fibromyalgia, IBS and recurrent sinus infection who is presenting with intermittent dizziness/vertigo since November 2022 associated with nausea and vomiting, last episode of severe dizziness lasted from December 15 through 25.  She had tried Epley maneuver but will stop midway due to severe dizziness and vomiting.  She reported seeing a chiropractor with minimal relief, she has not seen a ENT.  She also has not completed vestibular therapy.  She reports Reglan helps.  At this point I will advised the patient to continue with Reglan and meclizine, I will also send her to vestibular therapy.   She does report a history of brain tumor in her mother and grandmother, there is also tonsillar ectopia, I will repeat the MRI to exclude any etiology of her dizziness or headaches. I will contact the patient to go over MRI results, follow up with PCP.    1. Dizziness   2. Benign paroxysmal positional vertigo, unspecified laterality      Patient Instructions  MRI Brain with and without contrast to look for etiology of dizziness and patient also with history of Chiari I malformation  Vestibular therapy  Follow up with PCP  Orders Placed This Encounter  Procedures   MR BRAIN W WO CONTRAST   PT vestibular rehab    No orders of the defined types were placed in this encounter.   Return if symptoms worsen or fail to improve.    Alric Ran, MD 06/27/2021, 4:20 PM  [facility] [address], [place], [place] [address] [phone]

## 2021-07-26 ENCOUNTER — Ambulatory Visit
Admission: RE | Admit: 2021-07-26 | Discharge: 2021-07-26 | Disposition: A | Discharge status: Home / Self Care | Payer: 59 | Source: Ambulatory Visit | Attending: Neurology | Admitting: Neurology

## 2021-07-26 DIAGNOSIS — R42 Dizziness and giddiness: Secondary | ICD-10-CM

## 2021-07-26 MED ORDER — GADOBENATE DIMEGLUMINE 529 MG/ML IV SOLN
20.0000 mL | Freq: Once | INTRAVENOUS | Status: AC | PRN
  Administered 2021-07-26: 20 mL via INTRAVENOUS

## 2023-03-12 ENCOUNTER — Other Ambulatory Visit: Payer: Self-pay | Admitting: Anesthesiology

## 2023-03-12 ENCOUNTER — Ambulatory Visit
Admission: RE | Admit: 2023-03-12 | Discharge: 2023-03-12 | Disposition: A | Discharge status: Home / Self Care | Payer: 59 | Source: Ambulatory Visit | Attending: Anesthesiology | Admitting: Anesthesiology

## 2023-03-12 DIAGNOSIS — M25561 Pain in right knee: Secondary | ICD-10-CM

## 2023-04-08 ENCOUNTER — Other Ambulatory Visit (HOSPITAL_BASED_OUTPATIENT_CLINIC_OR_DEPARTMENT_OTHER): Payer: Self-pay

## 2023-04-08 DIAGNOSIS — R400 Somnolence: Secondary | ICD-10-CM

## 2023-04-08 DIAGNOSIS — R0681 Apnea, not elsewhere classified: Secondary | ICD-10-CM

## 2023-04-08 DIAGNOSIS — R0683 Snoring: Secondary | ICD-10-CM

## 2023-06-12 ENCOUNTER — Other Ambulatory Visit: Payer: Self-pay | Admitting: Medical Genetics

## 2023-06-17 ENCOUNTER — Other Ambulatory Visit (HOSPITAL_COMMUNITY): Payer: Self-pay

## 2023-06-24 ENCOUNTER — Other Ambulatory Visit (HOSPITAL_COMMUNITY)
Admission: RE | Admit: 2023-06-24 | Discharge: 2023-06-24 | Disposition: A | Discharge status: Home / Self Care | Payer: Self-pay | Source: Ambulatory Visit | Attending: Oncology | Admitting: Oncology

## 2023-07-06 LAB — GENECONNECT MOLECULAR SCREEN: Genetic Analysis Overall Interpretation: NEGATIVE

## 2023-07-16 ENCOUNTER — Ambulatory Visit (HOSPITAL_BASED_OUTPATIENT_CLINIC_OR_DEPARTMENT_OTHER): Payer: 59 | Attending: Anesthesiology | Admitting: Internal Medicine

## 2023-07-16 VITALS — Ht 66.0 in | Wt 225.0 lb

## 2023-07-16 DIAGNOSIS — R0681 Apnea, not elsewhere classified: Secondary | ICD-10-CM

## 2023-07-16 DIAGNOSIS — R0683 Snoring: Secondary | ICD-10-CM | POA: Diagnosis present

## 2023-07-16 DIAGNOSIS — R400 Somnolence: Secondary | ICD-10-CM

## 2023-07-21 DIAGNOSIS — R0683 Snoring: Secondary | ICD-10-CM

## 2023-07-21 NOTE — Procedures (Signed)
    Patient Name: Katie Elliott, Katie Elliott Date: 07/16/2023 Gender: Female D.O.B: 1975-10-07 Age (years): 48 Referring Provider: Thyra Breed Height (inches): 66 Interpreting Physician: Jetty Duhamel MD, ABSM Weight (lbs): 225 RPSGT: Shelah Lewandowsky BMI: 36 MRN: [id number] Neck Size: 15.00  CLINICAL INFORMATION Sleep Study Type: NPSG Indication for sleep study: Fatigue, Hypertension, Obesity, Sleep walking/talking/parasomnias, Snoring Epworth Sleepiness Score: 8  SLEEP STUDY TECHNIQUE As per the AASM Manual for the Scoring of Sleep and Associated Events v2.3 (April 2016) with a hypopnea requiring 4% desaturations.  The channels recorded and monitored were frontal, central and occipital EEG, electrooculogram (EOG), submentalis EMG (chin), nasal and oral airflow, thoracic and abdominal wall motion, anterior tibialis EMG, snore microphone, electrocardiogram, and pulse oximetry.  MEDICATIONS Medications self-administered by patient taken the night of the study : TYLENOL ARTHRITIS  SLEEP ARCHITECTURE The study was initiated at 9:38:04 PM and ended at 4:37:39 AM.  Sleep onset time was 34.4 minutes and the sleep efficiency was 62.8%. The total sleep time was 263.5 minutes.  Stage REM latency was N/A minutes.  The patient spent 10.1% of the night in stage N1 sleep, 89.9% in stage N2 sleep, 0.0% in stage N3 and 0% in REM.  Alpha intrusion was absent.  Supine sleep was 18.58%.  RESPIRATORY PARAMETERS The overall apnea/hypopnea index (AHI) was 0.9 per hour. There were 0 total apneas, including 0 obstructive, 0 central and 0 mixed apneas. There were 4 hypopneas and 27 RERAs.  The AHI during Stage REM sleep was N/A per hour.  AHI while supine was 4.9 per hour.  The mean oxygen saturation was 92.5%. The minimum SpO2 during sleep was 89.0%.  moderate snoring was noted during this study.  CARDIAC DATA The 2 lead EKG demonstrated sinus rhythm. The mean heart rate was 80.7 beats per  minute. Other EKG findings include: None.  LEG MOVEMENT DATA The total PLMS were 0 with a resulting PLMS index of 0.0. Associated arousal with leg movement index was 0.0 .  IMPRESSIONS - Occasional apneas and hypopneas, within normal limits (AHI = 0.9/h). - The patient had minimal or no oxygen desaturation during the study (Min O2 = 89.0%, Mean 92.5%) - The patient snored with moderate snoring volume. - No cardiac abnormalities were noted during this study. - Clinically significant periodic limb movements did not occur during sleep. No significant associated arousals.  DIAGNOSIS - Primary Snoring  RECOMMENDATIONS - Manage for snoring and symptoms based on clinical judgment. Encourage sleep off flat of back. - Sleep hygiene should be reviewed to assess factors that may improve sleep quality. - Weight management and regular exercise should be initiated or continued if appropriate.  [Electronically signed] 07/21/2023 11:04 AM  Jetty Duhamel MD, ABSM Diplomate, American Board of Sleep Medicine NPI: [id number]                         Jetty Duhamel Diplomate, American Board of Sleep Medicine  ELECTRONICALLY SIGNED ON:  07/21/2023, 11:00 AM [facility] PH: [phone]   FX: [phone] ACCREDITED BY THE AMERICAN ACADEMY OF SLEEP MEDICINE

## 2023-08-20 ENCOUNTER — Encounter (INDEPENDENT_AMBULATORY_CARE_PROVIDER_SITE_OTHER): Payer: Self-pay

## 2023-11-05 ENCOUNTER — Ambulatory Visit (INDEPENDENT_AMBULATORY_CARE_PROVIDER_SITE_OTHER)

## 2023-11-05 ENCOUNTER — Ambulatory Visit: Admitting: Podiatry

## 2023-11-05 ENCOUNTER — Encounter: Payer: Self-pay | Admitting: Podiatry

## 2023-11-05 DIAGNOSIS — M722 Plantar fascial fibromatosis: Secondary | ICD-10-CM

## 2023-11-05 DIAGNOSIS — M7751 Other enthesopathy of right foot: Secondary | ICD-10-CM

## 2023-11-05 DIAGNOSIS — M778 Other enthesopathies, not elsewhere classified: Secondary | ICD-10-CM

## 2023-11-05 DIAGNOSIS — M7752 Other enthesopathy of left foot: Secondary | ICD-10-CM | POA: Diagnosis not present

## 2023-11-05 MED ORDER — MELOXICAM 15 MG PO TABS: 15.0000 mg | ORAL_TABLET | Freq: Every day | ORAL | 3 refills | Status: AC

## 2023-11-05 MED ORDER — METHYLPREDNISOLONE 4 MG PO TBPK: ORAL_TABLET | ORAL | 0 refills | Status: AC

## 2023-11-05 NOTE — Progress Notes (Signed)
 Subjective:  Patient ID: Katie Elliott, female    DOB: [birth year],  MRN: [id number] HPI Chief Complaint  Patient presents with   Foot Pain    RM#7 Bilateral foot pay worsening in the last year or so no injuries to feet.    48 y.o. female presents with the above complaint.   ROS: Denies fever chills nausea vomiting muscle aches pains calf pain back pain chest pain shortness of breath.  Past Medical History:  Diagnosis Date   Acid reflux    Chronic sinus infection    Fibromyalgia    HTN (hypertension)    Monoclonal gammopathy    Past Surgical History:  Procedure Laterality Date   CHOLECYSTECTOMY     ESOPHAGOGASTRODUODENOSCOPY  05/30/2004   Surgeon Dr. Patra Bonnet: Normal esophagus, 4 cm hiatal hernia, normal stomach, normal duodenum.    KNEE SURGERY Left    thumb surgery Right     Current Outpatient Medications:    busPIRone (BUSPAR) 10 MG tablet, Take 10 mg by mouth as needed (for Anxiety)., Disp: , Rfl:    cyclobenzaprine (FLEXERIL) 10 MG tablet, Take 10 mg by mouth as needed. , Disp: , Rfl: 0   DULoxetine (CYMBALTA) 60 MG capsule, Take 60 mg by mouth daily. , Disp: , Rfl:    famotidine  (PEPCID ) 20 MG tablet, Take 1 tablet (20 mg total) by mouth Nightly., Disp: 30 tablet, Rfl: 5   lisinopril-hydrochlorothiazide (PRINZIDE,ZESTORETIC) 20-25 MG tablet, Take 1 tablet by mouth daily., Disp: , Rfl:    meloxicam (MOBIC) 15 MG tablet, Take 1 tablet (15 mg total) by mouth daily., Disp: 30 tablet, Rfl: 3   methylPREDNISolone  (MEDROL  DOSEPAK) 4 MG TBPK tablet, 6 day dose pack - take as directed, Disp: 21 tablet, Rfl: 0   metoCLOPramide  (REGLAN ) 10 MG tablet, Take 1 tablet (10 mg total) by mouth every 8 (eight) hours as needed for nausea (dizziness)., Disp: 30 tablet, Rfl: 0   naproxen (NAPROSYN) 500 MG tablet, Take 500 mg by mouth as needed for moderate pain. , Disp: , Rfl:    omeprazole (PRILOSEC) 20 MG capsule, Take 20 mg by mouth daily., Disp: , Rfl:    UNABLE TO FIND,  daily. CBD oil Takes 1 dropper daily, Disp: , Rfl:   Allergies  Allergen Reactions   Sulfa Antibiotics Itching and Rash    From childhood   Tape Rash   Review of Systems Objective:  There were no vitals filed for this visit.  General: Well developed, nourished, in no acute distress, alert and oriented x3   Dermatological: Skin is warm, dry and supple bilateral. Nails x 10 are well maintained; remaining integument appears unremarkable at this time. There are no open sores, no preulcerative lesions, no rash or signs of infection present.  Vascular: Dorsalis Pedis artery and Posterior Tibial artery pedal pulses are 2/4 bilateral with immedate capillary fill time. Pedal hair growth present. No varicosities and no lower extremity edema present bilateral.   Neruologic: Grossly intact via light touch bilateral. Vibratory intact via tuning fork bilateral. Protective threshold with Semmes Wienstein monofilament intact to all pedal sites bilateral. Patellar and Achilles deep tendon reflexes 2+ bilateral. No Babinski or clonus noted bilateral.   Musculoskeletal: No gross boney pedal deformities bilateral. No pain, crepitus, or limitation noted with foot and ankle range of motion bilateral. Muscular strength 5/5 in all groups tested bilateral.  Pain on palpation medial calcaneal tubercles bilateral.  Gait: Unassisted, Nonantalgic.    Radiographs:  Radiographs taken today demonstrate osseously mature  individual soft tissue increase in density plantar fascial calcaneal insertion sites bilateral.  Assessment & Plan:   Assessment: Plantar fasciitis bilateral elongated plantarflexed second metatarsals with capsulitis.  Plan: Injection plantar fascia bilateral heels point maximal tenderness.  20 mg was injected with local anesthetic.  Discussed appropriate shoe gear stretching exercise ice therapy and shoe gear modifications.  We will go ahead and get her started on methylprednisolone  to be followed  by meloxicam we will follow-up with me in 1 month.     Kieran Arreguin T. Winn, [place]

## 2023-12-03 ENCOUNTER — Ambulatory Visit: Admitting: Podiatry
